# Patient Record
Sex: Female | Born: 1970 | Race: White | Hispanic: No | Marital: Single | Smoking: Never smoker
Health system: Southern US, Community
[De-identification: ages and names within clinical notes are randomized; demographics above are authoritative.]

## PROBLEM LIST (undated history)

## (undated) DIAGNOSIS — F32A Depression, unspecified: Secondary | ICD-10-CM

## (undated) DIAGNOSIS — G629 Polyneuropathy, unspecified: Secondary | ICD-10-CM

## (undated) DIAGNOSIS — M5430 Sciatica, unspecified side: Secondary | ICD-10-CM

## (undated) DIAGNOSIS — M1990 Unspecified osteoarthritis, unspecified site: Secondary | ICD-10-CM

## (undated) DIAGNOSIS — R079 Chest pain, unspecified: Secondary | ICD-10-CM

## (undated) DIAGNOSIS — F4310 Post-traumatic stress disorder, unspecified: Secondary | ICD-10-CM

## (undated) DIAGNOSIS — F419 Anxiety disorder, unspecified: Secondary | ICD-10-CM

## (undated) DIAGNOSIS — E785 Hyperlipidemia, unspecified: Secondary | ICD-10-CM

## (undated) DIAGNOSIS — G8929 Other chronic pain: Secondary | ICD-10-CM

## (undated) DIAGNOSIS — R5382 Chronic fatigue, unspecified: Secondary | ICD-10-CM

## (undated) DIAGNOSIS — G4700 Insomnia, unspecified: Secondary | ICD-10-CM

## (undated) DIAGNOSIS — E739 Lactose intolerance, unspecified: Secondary | ICD-10-CM

## (undated) DIAGNOSIS — R0602 Shortness of breath: Secondary | ICD-10-CM

## (undated) DIAGNOSIS — F329 Major depressive disorder, single episode, unspecified: Secondary | ICD-10-CM

## (undated) DIAGNOSIS — Z91018 Allergy to other foods: Secondary | ICD-10-CM

## (undated) DIAGNOSIS — M797 Fibromyalgia: Secondary | ICD-10-CM

## (undated) DIAGNOSIS — R1310 Dysphagia, unspecified: Secondary | ICD-10-CM

## (undated) DIAGNOSIS — M2550 Pain in unspecified joint: Secondary | ICD-10-CM

## (undated) DIAGNOSIS — K219 Gastro-esophageal reflux disease without esophagitis: Secondary | ICD-10-CM

## (undated) DIAGNOSIS — I10 Essential (primary) hypertension: Secondary | ICD-10-CM

## (undated) DIAGNOSIS — R7303 Prediabetes: Secondary | ICD-10-CM

## (undated) DIAGNOSIS — E559 Vitamin D deficiency, unspecified: Secondary | ICD-10-CM

## (undated) DIAGNOSIS — M62838 Other muscle spasm: Secondary | ICD-10-CM

## (undated) DIAGNOSIS — M549 Dorsalgia, unspecified: Secondary | ICD-10-CM

## (undated) DIAGNOSIS — G9332 Myalgic encephalomyelitis/chronic fatigue syndrome: Secondary | ICD-10-CM

## (undated) DIAGNOSIS — K5900 Constipation, unspecified: Secondary | ICD-10-CM

## (undated) DIAGNOSIS — J45909 Unspecified asthma, uncomplicated: Secondary | ICD-10-CM

## (undated) DIAGNOSIS — K5792 Diverticulitis of intestine, part unspecified, without perforation or abscess without bleeding: Secondary | ICD-10-CM

## (undated) DIAGNOSIS — R002 Palpitations: Secondary | ICD-10-CM

## (undated) DIAGNOSIS — G4730 Sleep apnea, unspecified: Secondary | ICD-10-CM

## (undated) DIAGNOSIS — K589 Irritable bowel syndrome without diarrhea: Secondary | ICD-10-CM

## (undated) HISTORY — DX: Unspecified osteoarthritis, unspecified site: M19.90

## (undated) HISTORY — DX: Constipation, unspecified: K59.00

## (undated) HISTORY — DX: Sciatica, unspecified side: M54.30

## (undated) HISTORY — DX: Vitamin D deficiency, unspecified: E55.9

## (undated) HISTORY — DX: Polyneuropathy, unspecified: G62.9

## (undated) HISTORY — DX: Dysphagia, unspecified: R13.10

## (undated) HISTORY — DX: Allergy to other foods: Z91.018

## (undated) HISTORY — DX: Chest pain, unspecified: R07.9

## (undated) HISTORY — DX: Dorsalgia, unspecified: M54.9

## (undated) HISTORY — DX: Other muscle spasm: M62.838

## (undated) HISTORY — DX: Palpitations: R00.2

## (undated) HISTORY — DX: Fibromyalgia: M79.7

## (undated) HISTORY — DX: Pain in unspecified joint: M25.50

## (undated) HISTORY — DX: Shortness of breath: R06.02

## (undated) HISTORY — DX: Myalgic encephalomyelitis/chronic fatigue syndrome: G93.32

## (undated) HISTORY — DX: Lactose intolerance, unspecified: E73.9

## (undated) HISTORY — DX: Other chronic pain: G89.29

## (undated) HISTORY — DX: Chronic fatigue, unspecified: R53.82

## (undated) HISTORY — DX: Major depressive disorder, single episode, unspecified: F32.9

## (undated) HISTORY — DX: Anxiety disorder, unspecified: F41.9

## (undated) HISTORY — DX: Hyperlipidemia, unspecified: E78.5

## (undated) HISTORY — DX: Sleep apnea, unspecified: G47.30

## (undated) HISTORY — DX: Depression, unspecified: F32.A

## (undated) HISTORY — DX: Irritable bowel syndrome, unspecified: K58.9

## (undated) HISTORY — PX: NASAL SINUS SURGERY: SHX719

## (undated) HISTORY — DX: Essential (primary) hypertension: I10

## (undated) HISTORY — DX: Post-traumatic stress disorder, unspecified: F43.10

## (undated) HISTORY — DX: Insomnia, unspecified: G47.00

## (undated) HISTORY — DX: Prediabetes: R73.03

## (undated) HISTORY — PX: ABLATION: SHX5711

---

## 2012-04-03 ENCOUNTER — Emergency Department (HOSPITAL_BASED_OUTPATIENT_CLINIC_OR_DEPARTMENT_OTHER)
Admission: EM | Admit: 2012-04-03 | Discharge: 2012-04-04 | Disposition: A | Discharge status: Home / Self Care | Payer: Medicaid Other | Attending: Emergency Medicine | Admitting: Emergency Medicine

## 2012-04-03 ENCOUNTER — Encounter (HOSPITAL_BASED_OUTPATIENT_CLINIC_OR_DEPARTMENT_OTHER): Payer: Self-pay

## 2012-04-03 DIAGNOSIS — T50B95A Adverse effect of other viral vaccines, initial encounter: Secondary | ICD-10-CM | POA: Insufficient documentation

## 2012-04-03 DIAGNOSIS — T7840XA Allergy, unspecified, initial encounter: Secondary | ICD-10-CM

## 2012-04-03 DIAGNOSIS — Z331 Pregnant state, incidental: Secondary | ICD-10-CM | POA: Insufficient documentation

## 2012-04-03 DIAGNOSIS — L299 Pruritus, unspecified: Secondary | ICD-10-CM | POA: Insufficient documentation

## 2012-04-03 DIAGNOSIS — R232 Flushing: Secondary | ICD-10-CM | POA: Insufficient documentation

## 2012-04-03 DIAGNOSIS — T50A95A Adverse effect of other bacterial vaccines, initial encounter: Secondary | ICD-10-CM | POA: Insufficient documentation

## 2012-04-03 DIAGNOSIS — Z888 Allergy status to other drugs, medicaments and biological substances status: Secondary | ICD-10-CM | POA: Insufficient documentation

## 2012-04-03 NOTE — ED Notes (Signed)
Pt took benadryl 25mg  PTA

## 2012-04-03 NOTE — ED Notes (Addendum)
C/o face red and chest redness, throat itching x 1.5 hrs-pt NAD-states she feels may be having reaction to flu shot that was given at OB/GYN office approx 330pm -? Slight redness to chest and cheeks-no resp distress noted-pt is [redacted] weeks pregnant

## 2012-04-03 NOTE — ED Provider Notes (Signed)
History  This chart was scribed for Joya Gaskins, MD by Manuela Schwartz, ED scribe. This patient was seen in room MH01/MH01 and the patient's care was started at 2124.   CSN: [id number]  Arrival date & time 04/03/12  2124   First MD Initiated Contact with Patient 04/03/12 2357      Chief Complaint  Patient presents with  . Allergic Reaction   Patient is a 41 y.o. female presenting with allergic reaction. The history is provided by the patient. No language interpreter was used.  Allergic Reaction The primary symptoms are  rash (redness over face). The primary symptoms do not include wheezing, shortness of breath, cough, nausea, vomiting or dizziness. The current episode started 3 to 5 hours ago. The problem has been gradually improving.  The rash is associated with itching.  The onset of the reaction was associated with a new medication. Significant symptoms also include flushing and itching. Significant symptoms that are not present include rhinorrhea.   Brooke Cruz is a 41 y.o. female who presents to the Emergency Department complaining of having an allergic reaction to a flu vaccine she received while at [facility] this PM and began feeling scratchy throat, red itchy face/lips. She states took benadryl PTA with some relief from her sx. She denies SOB, syncope, dizziness, fever, emesis. She has had a flue shot before without an allergic reaction and she denies any other recent medicines. She is [redacted] weeks pregnant and had normal [place] at 7 weeks.  No vag bleeding.  No abd cramping Past Medical History  Diagnosis Date  . Pregnant     Past Surgical History  Procedure Date  . Cesarean section   . Nasal sinus surgery     No family history on file.  History  Substance Use Topics  . Smoking status: Never Smoker   . Smokeless tobacco: Not on file  . Alcohol Use: No    OB History    Grav Para Term Preterm Abortions TAB SAB Ect Mult Living                  Review of Systems    Constitutional: Negative for fever and chills.  HENT: Negative for congestion and rhinorrhea.   Respiratory: Negative for cough, shortness of breath and wheezing.   Cardiovascular: Negative for chest pain.  Gastrointestinal: Negative for nausea and vomiting.  Skin: Positive for flushing, itching and rash (redness over face).  Neurological: Negative for dizziness and weakness.  All other systems reviewed and are negative.    Allergies  Eggs or egg-derived products and Sulfa antibiotics  Home Medications   Current Outpatient Rx  Name  Route  Sig  Dispense  Refill  . ZYRTEC PO   Oral   Take by mouth.         . COLACE PO   Oral   Take by mouth.         Marland Kitchen PRENATAL VITAMINS PO   Oral   Take by mouth.           Triage Vitals: BP 149/87  Pulse 87  Temp 99.2 F (37.3 C) (Oral)  Resp 16  SpO2 100%  Physical Exam CONSTITUTIONAL: Well developed/well nourished HEAD AND FACE: Normocephalic/atraumatic EYES: EOMI/PERRL ENMT: Mucous membranes moist, no angioedema, no facial erythema NECK: supple no meningeal signs SPINE:entire spine nontender CV: S1/S2 noted, no murmurs/rubs/gallops noted LUNGS: Lungs are clear to auscultation bilaterally, no apparent distress ABDOMEN: soft, nontender, no rebound or guarding GU:no cva tenderness NEURO:  Pt is awake/alert, moves all extremitiesx4 EXTREMITIES: pulses normal, full ROM SKIN: warm, color normal, no rash and no urticaria PSYCH: no abnormalities of mood noted  ED Course  Procedures  DIAGNOSTIC STUDIES: Oxygen Saturation is 100% on room air, normal by my interpretation.    COORDINATION OF CARE: At 1200 AM Discussed treatment plan with patient which includes d/c home w/epi pen and how/when to use it if needed. Patient agrees.    Pt well appearing no signs of allergic rxn on my exam.  She already improved after self medicating with benadryl.  Defer prednisone for now.  I did write Rx for epipen in case of severe allergic  rxn at home.  We discussed when to use epipen.  Does not require epipen at this time  MDM  Nursing notes including past medical history and social history reviewed and considered in documentation  I personally performed the services described in this documentation, which was scribed in my presence. The recorded information has been reviewed and is accurate.            Joya Gaskins, MD 04/04/12 [phone]

## 2012-04-04 MED ORDER — EPINEPHRINE 0.3 MG/0.3ML IJ DEVI: 0.3000 mg | INTRAMUSCULAR | Status: AC | PRN

## 2012-04-04 NOTE — ED Notes (Signed)
MD at bedside. 

## 2012-06-18 ENCOUNTER — Encounter (HOSPITAL_BASED_OUTPATIENT_CLINIC_OR_DEPARTMENT_OTHER): Payer: Self-pay | Admitting: *Deleted

## 2012-06-18 ENCOUNTER — Emergency Department (HOSPITAL_BASED_OUTPATIENT_CLINIC_OR_DEPARTMENT_OTHER)
Admission: EM | Admit: 2012-06-18 | Discharge: 2012-06-18 | Disposition: A | Discharge status: Home / Self Care | Payer: Medicaid Other | Attending: Emergency Medicine | Admitting: Emergency Medicine

## 2012-06-18 DIAGNOSIS — O2390 Unspecified genitourinary tract infection in pregnancy, unspecified trimester: Secondary | ICD-10-CM | POA: Insufficient documentation

## 2012-06-18 DIAGNOSIS — R350 Frequency of micturition: Secondary | ICD-10-CM | POA: Insufficient documentation

## 2012-06-18 DIAGNOSIS — R300 Dysuria: Secondary | ICD-10-CM | POA: Insufficient documentation

## 2012-06-18 DIAGNOSIS — N390 Urinary tract infection, site not specified: Secondary | ICD-10-CM | POA: Insufficient documentation

## 2012-06-18 DIAGNOSIS — O9989 Other specified diseases and conditions complicating pregnancy, childbirth and the puerperium: Secondary | ICD-10-CM | POA: Insufficient documentation

## 2012-06-18 DIAGNOSIS — R109 Unspecified abdominal pain: Secondary | ICD-10-CM | POA: Insufficient documentation

## 2012-06-18 DIAGNOSIS — O219 Vomiting of pregnancy, unspecified: Secondary | ICD-10-CM | POA: Insufficient documentation

## 2012-06-18 LAB — URINALYSIS, ROUTINE W REFLEX MICROSCOPIC
Glucose, UA: NEGATIVE mg/dL
Hgb urine dipstick: NEGATIVE
pH: 6 (ref 5.0–8.0)

## 2012-06-18 LAB — URINE MICROSCOPIC-ADD ON

## 2012-06-18 LAB — CBC WITH DIFFERENTIAL/PLATELET
Band Neutrophils: 2 % (ref 0–10)
Basophils Absolute: 0.1 10*3/uL (ref 0.0–0.1)
Basophils Relative: 1 % (ref 0–1)
Blasts: 0 %
Eosinophils Absolute: 0.2 10*3/uL (ref 0.0–0.7)
Eosinophils Relative: 2 % (ref 0–5)
HCT: 34 % — ABNORMAL LOW (ref 36.0–46.0)
Hemoglobin: 11.6 g/dL — ABNORMAL LOW (ref 12.0–15.0)
Lymphocytes Relative: 15 % (ref 12–46)
Lymphs Abs: 1.5 10*3/uL (ref 0.7–4.0)
Monocytes Absolute: 1 10*3/uL (ref 0.1–1.0)
Monocytes Relative: 10 % (ref 3–12)
RBC: 3.83 MIL/uL — ABNORMAL LOW (ref 3.87–5.11)
WBC: 9.9 10*3/uL (ref 4.0–10.5)

## 2012-06-18 LAB — COMPREHENSIVE METABOLIC PANEL
AST: 12 U/L (ref 0–37)
CO2: 21 mEq/L (ref 19–32)
Calcium: 9 mg/dL (ref 8.4–10.5)
Creatinine, Ser: 0.5 mg/dL (ref 0.50–1.10)
GFR calc non Af Amer: >90 mL/min (ref >90)

## 2012-06-18 MED ORDER — DEXTROSE 5 % IV SOLN
1.0000 g | INTRAVENOUS | Status: DC
  Administered 2012-06-18: 1 g via INTRAVENOUS
  Filled 2012-06-18: qty 10

## 2012-06-18 MED ORDER — SODIUM CHLORIDE 0.9 % IV BOLUS (SEPSIS)
1000.0000 mL | Freq: Once | INTRAVENOUS | Status: AC
  Administered 2012-06-18: 1000 mL via INTRAVENOUS

## 2012-06-18 MED ORDER — AMOXICILLIN 500 MG PO CAPS: 500.0000 mg | ORAL_CAPSULE | Freq: Three times a day (TID) | ORAL | Status: DC

## 2012-06-18 NOTE — Progress Notes (Signed)
Received call from [facility]. Pt presents with lower abd pressure and painful urination. Receives care from [facility].

## 2012-06-18 NOTE — ED Notes (Signed)
EDPA Sofia at bedside. 

## 2012-06-18 NOTE — ED Provider Notes (Signed)
History     CSN: [id number]  Arrival date & time 06/18/12  1758   First MD Initiated Contact with Patient 06/18/12 1828      Chief Complaint  Patient presents with  . Abdominal Pain    (Consider location/radiation/quality/duration/timing/severity/associated sxs/prior treatment) Patient is a 42 y.o. female presenting with dysuria. The history is provided by the patient. No language interpreter was used.  Dysuria  This is a new problem. The current episode started 12 to 24 hours ago. The problem occurs every urination. The problem has been gradually worsening. The quality of the pain is described as burning. The patient is experiencing no pain. There has been no fever. Associated symptoms include nausea, vomiting and frequency. Pertinent negatives include no chills. She has tried nothing for the symptoms. Her past medical history does not include kidney stones.   Pt reports she began having burning with urination.  Pt is [redacted] weeks pregnant Past Medical History  Diagnosis Date  . Pregnant     Past Surgical History  Procedure Laterality Date  . Cesarean section    . Nasal sinus surgery      History reviewed. No pertinent family history.  History  Substance Use Topics  . Smoking status: Never Smoker   . Smokeless tobacco: Not on file  . Alcohol Use: No    OB History   Grav Para Term Preterm Abortions TAB SAB Ect Mult Living   6 3   2  2          Review of Systems  Constitutional: Negative for chills.  Gastrointestinal: Positive for nausea and vomiting.  Genitourinary: Positive for dysuria and frequency.  All other systems reviewed and are negative.    Allergies  Eggs or egg-derived products and Sulfa antibiotics  Home Medications   Current Outpatient Rx  Name  Route  Sig  Dispense  Refill  . Cetirizine HCl (ZYRTEC PO)   Oral   Take by mouth.         Tery Sanfilippo Sodium (COLACE PO)   Oral   Take by mouth.         . EPINEPHrine (EPIPEN) 0.3 mg/0.3 mL  DEVI   Intramuscular   Inject 0.3 mLs (0.3 mg total) into the muscle as needed.   2 Device   1   . PRENATAL VITAMINS PO   Oral   Take by mouth.           BP 134/77  Pulse 86  Temp(Src) 98.7 F (37.1 C) (Oral)  Resp 20  Ht 5\' 2"  (1.575 m)  Wt 169 lb 8 oz (76.885 kg)  BMI 30.99 kg/m2  SpO2 99%  LMP 01/19/2012  Physical Exam  Nursing note and vitals reviewed. Constitutional: She is oriented to person, place, and time. She appears well-developed and well-nourished.  HENT:  Head: Normocephalic and atraumatic.  Eyes: Conjunctivae are normal. Pupils are equal, round, and reactive to light.  Neck: Normal range of motion. Neck supple.  Cardiovascular: Normal rate.   Pulmonary/Chest: Effort normal and breath sounds normal.  Abdominal: Soft. There is no tenderness.  Musculoskeletal: Normal range of motion.  Neurological: She is alert and oriented to person, place, and time. She has normal reflexes.  Skin: Skin is warm.  Psychiatric: She has a normal mood and affect.    ED Course  Procedures (including critical care time)  Labs Reviewed  URINALYSIS, ROUTINE W REFLEX MICROSCOPIC - Abnormal; Notable for the following:    APPearance CLOUDY (*)  Leukocytes, UA SMALL (*)    All other components within normal limits  URINE MICROSCOPIC-ADD ON - Abnormal; Notable for the following:    Bacteria, UA MANY (*)    Crystals CA OXALATE CRYSTALS (*)    All other components within normal limits  URINE CULTURE   No results found.   1. UTI (lower urinary tract infection)       MDM  Pt given Iv rocephin and 1 liter of fluids.   Pt advised follow up with Dr. Shawnie Pons as scheduled.  Urine culture pending.  Amoxicillian        Elson Areas, PA-C 06/18/12 2029

## 2012-06-18 NOTE — ED Notes (Signed)
Pt states she is [redacted] weeks pregnant G6P3 and is having some dysuria, cramping and pelvic pressure. No discharge.

## 2012-06-18 NOTE — ED Notes (Signed)
EDPA Sofia updated on pt status

## 2012-06-18 NOTE — Progress Notes (Signed)
Received call from [facility] RN from [facility] that patient is discharged home

## 2012-06-19 NOTE — ED Provider Notes (Signed)
Medical screening examination/treatment/procedure(s) were performed by non-physician practitioner and as supervising physician I was immediately available for consultation/collaboration.   Jakalyn Kratky III, MD 06/19/12 1027 

## 2012-06-20 LAB — URINE CULTURE

## 2012-11-01 ENCOUNTER — Emergency Department (HOSPITAL_BASED_OUTPATIENT_CLINIC_OR_DEPARTMENT_OTHER)
Admission: EM | Admit: 2012-11-01 | Discharge: 2012-11-02 | Disposition: A | Discharge status: Home / Self Care | Payer: Medicaid Other | Attending: Emergency Medicine | Admitting: Emergency Medicine

## 2012-11-01 ENCOUNTER — Encounter (HOSPITAL_BASED_OUTPATIENT_CLINIC_OR_DEPARTMENT_OTHER): Payer: Self-pay

## 2012-11-01 DIAGNOSIS — N898 Other specified noninflammatory disorders of vagina: Secondary | ICD-10-CM | POA: Insufficient documentation

## 2012-11-01 DIAGNOSIS — Z3202 Encounter for pregnancy test, result negative: Secondary | ICD-10-CM | POA: Insufficient documentation

## 2012-11-01 DIAGNOSIS — N390 Urinary tract infection, site not specified: Secondary | ICD-10-CM | POA: Insufficient documentation

## 2012-11-01 DIAGNOSIS — Z79899 Other long term (current) drug therapy: Secondary | ICD-10-CM | POA: Insufficient documentation

## 2012-11-01 DIAGNOSIS — Z9889 Other specified postprocedural states: Secondary | ICD-10-CM | POA: Insufficient documentation

## 2012-11-01 DIAGNOSIS — N939 Abnormal uterine and vaginal bleeding, unspecified: Secondary | ICD-10-CM

## 2012-11-01 DIAGNOSIS — Z91040 Latex allergy status: Secondary | ICD-10-CM | POA: Insufficient documentation

## 2012-11-01 LAB — URINALYSIS, ROUTINE W REFLEX MICROSCOPIC
Nitrite: NEGATIVE
Specific Gravity, Urine: 1.012 (ref 1.005–1.030)
Urobilinogen, UA: 0.2 mg/dL (ref 0.0–1.0)

## 2012-11-01 LAB — URINE MICROSCOPIC-ADD ON

## 2012-11-01 LAB — PREGNANCY, URINE: Preg Test, Ur: NEGATIVE

## 2012-11-01 NOTE — ED Notes (Signed)
Csection 7/13-c/o pain to lower abd, rectum, lower back-pain worsened after formed BM approx 1 hour PTA

## 2012-11-02 ENCOUNTER — Emergency Department (HOSPITAL_BASED_OUTPATIENT_CLINIC_OR_DEPARTMENT_OTHER): Payer: Medicaid Other

## 2012-11-02 LAB — COMPREHENSIVE METABOLIC PANEL
ALT: 17 U/L (ref 0–35)
AST: 19 U/L (ref 0–37)
Albumin: 3.2 g/dL — ABNORMAL LOW (ref 3.5–5.2)
CO2: 27 mEq/L (ref 19–32)
Calcium: 10.1 mg/dL (ref 8.4–10.5)
Chloride: 104 mEq/L (ref 96–112)
GFR calc non Af Amer: >90 mL/min (ref >90)
Sodium: 140 mEq/L (ref 135–145)
Total Bilirubin: 0.2 mg/dL — ABNORMAL LOW (ref 0.3–1.2)

## 2012-11-02 LAB — CBC WITH DIFFERENTIAL/PLATELET
Basophils Absolute: 0 10*3/uL (ref 0.0–0.1)
Lymphocytes Relative: 24 % (ref 12–46)
Neutro Abs: 4 10*3/uL (ref 1.7–7.7)
Platelets: 367 10*3/uL (ref 150–400)
RDW: 15 % (ref 11.5–15.5)
WBC: 6.4 10*3/uL (ref 4.0–10.5)

## 2012-11-02 LAB — WET PREP, GENITAL
Clue Cells Wet Prep HPF POC: NONE SEEN
Yeast Wet Prep HPF POC: NONE SEEN

## 2012-11-02 MED ORDER — METHYLERGONOVINE MALEATE 0.2 MG PO TABS: 0.2000 mg | ORAL_TABLET | Freq: Three times a day (TID) | ORAL | Status: DC

## 2012-11-02 MED ORDER — CEPHALEXIN 500 MG PO CAPS: 500.0000 mg | ORAL_CAPSULE | Freq: Four times a day (QID) | ORAL | Status: DC

## 2012-11-02 MED ORDER — ONDANSETRON HCL 4 MG/2ML IJ SOLN
4.0000 mg | Freq: Once | INTRAMUSCULAR | Status: AC
  Administered 2012-11-02: 4 mg via INTRAVENOUS
  Filled 2012-11-02: qty 2

## 2012-11-02 MED ORDER — HYDROCODONE-ACETAMINOPHEN 5-325 MG PO TABS: 2.0000 | ORAL_TABLET | ORAL | Status: DC | PRN

## 2012-11-02 MED ORDER — MORPHINE SULFATE 4 MG/ML IJ SOLN
4.0000 mg | Freq: Once | INTRAMUSCULAR | Status: AC
  Administered 2012-11-02: 4 mg via INTRAVENOUS
  Filled 2012-11-02: qty 1

## 2012-11-02 NOTE — ED Provider Notes (Signed)
CSN: [id number]     Arrival date & time 11/01/12  2245 History     First MD Initiated Contact with Patient 11/02/12 0040     Chief Complaint  Patient presents with  . Abdominal Pain   (Consider location/radiation/quality/duration/timing/severity/associated sxs/prior Treatment) Patient is a 42 y.o. female presenting with abdominal pain. The history is provided by the patient.  Abdominal Pain This is a new problem. The current episode started more than 1 week ago (worse while having a B< just prior to arrival.  But has been ongoing since c section on 7/13). The problem occurs constantly. The problem has been gradually worsening. Associated symptoms include abdominal pain. Pertinent negatives include no chest pain, no headaches and no shortness of breath. Nothing aggravates the symptoms. Nothing relieves the symptoms. She has tried nothing for the symptoms. The treatment provided no relief.  Dark lochia is ongoing.    Past Medical History  Diagnosis Date  . Pregnant    Past Surgical History  Procedure Laterality Date  . Cesarean section    . Nasal sinus surgery     No family history on file. History  Substance Use Topics  . Smoking status: Never Smoker   . Smokeless tobacco: Not on file  . Alcohol Use: No   OB History   Grav Para Term Preterm Abortions TAB SAB Ect Mult Living   6 3   2  2         Review of Systems  Constitutional: Negative for fever.  Respiratory: Negative for shortness of breath.   Cardiovascular: Negative for chest pain.  Gastrointestinal: Positive for abdominal pain. Negative for vomiting and diarrhea.  Genitourinary: Positive for vaginal bleeding. Negative for dysuria and vaginal discharge.  Neurological: Negative for headaches.  All other systems reviewed and are negative.    Allergies  Eggs or egg-derived products; Fluoride preparations; Latex; and Sulfa antibiotics  Home Medications   Current Outpatient Rx  Name  Route  Sig  Dispense  Refill   . labetalol (NORMODYNE) 200 MG tablet   Oral   Take 200 mg by mouth 2 (two) times daily.         Marland Kitchen amoxicillin (AMOXIL) 500 MG capsule   Oral   Take 1 capsule (500 mg total) by mouth 3 (three) times daily.   30 capsule   0   . amoxicillin (AMOXIL) 500 MG capsule   Oral   Take 1 capsule (500 mg total) by mouth 3 (three) times daily.   30 capsule   0   . Cetirizine HCl (ZYRTEC PO)   Oral   Take by mouth.         Tery Sanfilippo Sodium (COLACE PO)   Oral   Take by mouth.         . EPINEPHrine (EPIPEN) 0.3 mg/0.3 mL DEVI   Intramuscular   Inject 0.3 mLs (0.3 mg total) into the muscle as needed.   2 Device   1   . PRENATAL VITAMINS PO   Oral   Take by mouth.          BP 141/78  Pulse 74  Temp(Src) 98.8 F (37.1 C) (Oral)  Resp 16  Ht 5\' 2"  (1.575 m)  SpO2 98%  LMP 01/19/2012  Breastfeeding? Unknown Physical Exam  Constitutional: She is oriented to person, place, and time. She appears well-developed and well-nourished. No distress.  HENT:  Head: Normocephalic and atraumatic.  Mouth/Throat: Oropharynx is clear and moist.  Eyes: Conjunctivae are normal. Pupils are  equal, round, and reactive to light.  Neck: Normal range of motion. Neck supple.  Cardiovascular: Normal rate, regular rhythm and intact distal pulses.   Pulmonary/Chest: Effort normal and breath sounds normal. She has no wheezes. She has no rales.  Abdominal: Soft. She exhibits no mass. Bowel sounds are increased. There is no tenderness. There is no rebound and no guarding.  Genitourinary: Cervix exhibits no motion tenderness. Right adnexum displays no mass and no tenderness. Left adnexum displays no mass and no tenderness.  Chaperone present dark blood and small clot per os.  No CMT no adnexal tenderness  Musculoskeletal: Normal range of motion.  Neurological: She is alert and oriented to person, place, and time.  Skin: Skin is warm and dry.  Psychiatric: She has a normal mood and affect.    ED  Course   Procedures (including critical care time)  Labs Reviewed  URINALYSIS, ROUTINE W REFLEX MICROSCOPIC - Abnormal; Notable for the following:    Hgb urine dipstick LARGE (*)    Leukocytes, UA LARGE (*)    All other components within normal limits  URINE MICROSCOPIC-ADD ON - Abnormal; Notable for the following:    Bacteria, UA FEW (*)    All other components within normal limits  CBC WITH DIFFERENTIAL - Abnormal; Notable for the following:    RBC 3.73 (*)    Hemoglobin 10.6 (*)    HCT 33.0 (*)    All other components within normal limits  COMPREHENSIVE METABOLIC PANEL - Abnormal; Notable for the following:    Albumin 3.2 (*)    Alkaline Phosphatase 371 (*)    Total Bilirubin 0.2 (*)    All other components within normal limits  URINE CULTURE  PREGNANCY, URINE   Dg Abd Acute W/chest  11/02/2012   *RADIOLOGY REPORT*  Clinical Data: Lower abdominal pain.  History of C-section 2 weeks ago.  ACUTE ABDOMEN SERIES (ABDOMEN 2 VIEW & CHEST 1 VIEW)  Comparison: None.  Findings: Subsegmental atelectasis is present in the lungs, prominent in the right perihilar region. Cardiopericardial silhouette is within normal limits.  There is no free air underneath the hemidiaphragms.  Bowel gas pattern is within normal limits.  Stool and bowel gas extends to the level of the rectosigmoid.  No dilated loops of large or small bowel.  No organomegaly.  IMPRESSION: Normal bowel gas pattern.   Original Report Authenticated By: Andreas Newport, M.D.   No diagnosis found.  MDM  Attempted to contact patient's OB, no oncall provider.    Records obtained and scanned in to patient's chart  234 am case d/w Dr. Emelda Fear. Very unlikely to be endometritis without fever, white count or foul smelling lochia.  Patient should follow up with her GYN in am for ultrasound as likely has retained blood.  Recommends methergine.  Pain in the rectum while using the commode was likely related to gas.  There is no indication for  emergent [place].  Methergine is safe in breast feeding as is norco and patient will need this for cramping related to methergine.  Dr. Emelda Fear reports work up is thorough.     Patient instructed to call her regular OB in the am to be seen and evaluated  Jalaiya Oyster Smitty Cords, MD 11/02/12 [phone]

## 2012-11-02 NOTE — ED Notes (Signed)
MD at bedside giving test results and discussing plan of care for discharge.

## 2012-11-04 LAB — URINE CULTURE: Colony Count: 50000

## 2013-03-30 ENCOUNTER — Encounter (HOSPITAL_BASED_OUTPATIENT_CLINIC_OR_DEPARTMENT_OTHER): Payer: Self-pay | Admitting: Emergency Medicine

## 2013-03-30 DIAGNOSIS — Z91040 Latex allergy status: Secondary | ICD-10-CM | POA: Insufficient documentation

## 2013-03-30 DIAGNOSIS — Z792 Long term (current) use of antibiotics: Secondary | ICD-10-CM | POA: Insufficient documentation

## 2013-03-30 DIAGNOSIS — G43909 Migraine, unspecified, not intractable, without status migrainosus: Secondary | ICD-10-CM | POA: Insufficient documentation

## 2013-03-30 DIAGNOSIS — Z79899 Other long term (current) drug therapy: Secondary | ICD-10-CM | POA: Insufficient documentation

## 2013-03-30 NOTE — ED Notes (Signed)
Pt c/o h/a x 24 hrs  Hx migraine

## 2013-03-31 ENCOUNTER — Emergency Department (HOSPITAL_BASED_OUTPATIENT_CLINIC_OR_DEPARTMENT_OTHER)
Admission: EM | Admit: 2013-03-31 | Discharge: 2013-03-31 | Disposition: A | Discharge status: Home / Self Care | Payer: Medicaid Other | Attending: Emergency Medicine | Admitting: Emergency Medicine

## 2013-03-31 DIAGNOSIS — G43909 Migraine, unspecified, not intractable, without status migrainosus: Secondary | ICD-10-CM

## 2013-03-31 MED ORDER — DIPHENHYDRAMINE HCL 50 MG/ML IJ SOLN
12.5000 mg | Freq: Once | INTRAMUSCULAR | Status: AC
  Administered 2013-03-31: 12.5 mg via INTRAMUSCULAR
  Filled 2013-03-31: qty 1

## 2013-03-31 MED ORDER — METOCLOPRAMIDE HCL 5 MG/ML IJ SOLN
10.0000 mg | Freq: Once | INTRAMUSCULAR | Status: AC
  Administered 2013-03-31: 10 mg via INTRAMUSCULAR
  Filled 2013-03-31: qty 2

## 2013-03-31 MED ORDER — PROMETHAZINE HCL 25 MG/ML IJ SOLN
25.0000 mg | Freq: Four times a day (QID) | INTRAMUSCULAR | Status: DC | PRN
  Administered 2013-03-31: 25 mg via INTRAMUSCULAR

## 2013-03-31 MED ORDER — DEXAMETHASONE SODIUM PHOSPHATE 10 MG/ML IJ SOLN
10.0000 mg | Freq: Once | INTRAMUSCULAR | Status: AC
Start: 2013-03-31 — End: 2013-03-31
  Administered 2013-03-31: 10 mg via INTRAMUSCULAR
  Filled 2013-03-31: qty 1

## 2013-03-31 MED ORDER — PROMETHAZINE HCL 25 MG/ML IJ SOLN
25.0000 mg | Freq: Once | INTRAMUSCULAR | Status: DC
  Filled 2013-03-31: qty 1

## 2013-03-31 NOTE — ED Provider Notes (Signed)
CSN: [id number]     Arrival date & time 03/30/13  2324 History   First MD Initiated Contact with Patient 03/31/13 0146     Chief Complaint  Patient presents with  . Migraine   (Consider location/radiation/quality/duration/timing/severity/associated sxs/prior Treatment) Patient is a 42 y.o. female presenting with migraines. The history is provided by the patient. No language interpreter was used.  Migraine This is a recurrent problem. The current episode started more than 2 days ago. The problem occurs constantly. The problem has been gradually improving. Pertinent negatives include no chest pain, no abdominal pain and no shortness of breath. Nothing aggravates the symptoms. Nothing relieves the symptoms. Treatments tried: fiorcet. The treatment provided mild relief.    Past Medical History  Diagnosis Date  . Pregnant    Past Surgical History  Procedure Laterality Date  . Cesarean section    . Nasal sinus surgery     History reviewed. No pertinent family history. History  Substance Use Topics  . Smoking status: Never Smoker   . Smokeless tobacco: Not on file  . Alcohol Use: No   OB History   Grav Para Term Preterm Abortions TAB SAB Ect Mult Living   6 3   2  2         Review of Systems  Constitutional: Negative for fever.  HENT: Negative for ear pain.   Eyes: Negative for photophobia and visual disturbance.  Respiratory: Negative for shortness of breath.   Cardiovascular: Negative for chest pain.  Gastrointestinal: Negative for abdominal pain.  Musculoskeletal: Negative for neck pain and neck stiffness.  Neurological: Negative for weakness and numbness.  All other systems reviewed and are negative.    Allergies  Eggs or egg-derived products; Latex; Onion; and Sulfa antibiotics  Home Medications   Current Outpatient Rx  Name  Route  Sig  Dispense  Refill  . amoxicillin (AMOXIL) 500 MG capsule   Oral   Take 1 capsule (500 mg total) by mouth 3 (three) times  daily.   30 capsule   0   . amoxicillin (AMOXIL) 500 MG capsule   Oral   Take 1 capsule (500 mg total) by mouth 3 (three) times daily.   30 capsule   0   . cephALEXin (KEFLEX) 500 MG capsule   Oral   Take 1 capsule (500 mg total) by mouth 4 (four) times daily.   28 capsule   0   . Cetirizine HCl (ZYRTEC PO)   Oral   Take by mouth.         Tery Sanfilippo Sodium (COLACE PO)   Oral   Take by mouth.         . EPINEPHrine (EPIPEN) 0.3 mg/0.3 mL DEVI   Intramuscular   Inject 0.3 mLs (0.3 mg total) into the muscle as needed.   2 Device   1   . HYDROcodone-acetaminophen (NORCO) 5-325 MG per tablet   Oral   Take 2 tablets by mouth every 4 (four) hours as needed for pain.   6 tablet   0   . labetalol (NORMODYNE) 200 MG tablet   Oral   Take 200 mg by mouth 2 (two) times daily.         . methylergonovine (METHERGINE) 0.2 MG tablet   Oral   Take 1 tablet (0.2 mg total) by mouth 3 (three) times daily.   21 tablet   0   . PRENATAL VITAMINS PO   Oral   Take by mouth.  BP 155/84  Pulse 71  Temp(Src) 98.4 F (36.9 C) (Oral)  Resp 18  Ht 5\' 2"  (1.575 m)  Wt 169 lb (76.658 kg)  BMI 30.90 kg/m2  SpO2 98% Physical Exam  Constitutional: She is oriented to person, place, and time. She appears well-developed and well-nourished. No distress.  HENT:  Head: Normocephalic and atraumatic.  Mouth/Throat: Oropharynx is clear and moist.  Eyes: Conjunctivae and EOM are normal. Pupils are equal, round, and reactive to light.  Neck: Normal range of motion. Neck supple.  Cardiovascular: Normal rate and regular rhythm.   Pulmonary/Chest: Effort normal and breath sounds normal. She has no wheezes. She has no rales.  Abdominal: Soft. Bowel sounds are normal. There is no tenderness. There is no rebound and no guarding.  Musculoskeletal: Normal range of motion.  Neurological: She is alert and oriented to person, place, and time. She has normal reflexes. No cranial nerve  deficit.  Skin: Skin is warm and dry.  Psychiatric: She has a normal mood and affect.    ED Course  Procedures (including critical care time) Labs Review Labs Reviewed - No data to display Imaging Review No results found.  EKG Interpretation   None       MDM   1. Migraine    Exam and vitals benign and reassuring. No indication for CT nor LP.  Return for fevers, stiff neck or focal neuro deficits  Follow up with your migraine specialist for ongoing care    Julienne Vogler K Duff Pozzi-Rasch, MD 03/31/13 [phone]

## 2013-05-12 ENCOUNTER — Emergency Department (HOSPITAL_COMMUNITY)
Admission: EM | Admit: 2013-05-12 | Discharge: 2013-05-12 | Disposition: A | Discharge status: Home / Self Care | Payer: No Typology Code available for payment source | Attending: Emergency Medicine | Admitting: Emergency Medicine

## 2013-05-12 ENCOUNTER — Encounter (HOSPITAL_COMMUNITY): Payer: Self-pay | Admitting: Emergency Medicine

## 2013-05-12 ENCOUNTER — Emergency Department (HOSPITAL_COMMUNITY): Payer: No Typology Code available for payment source

## 2013-05-12 DIAGNOSIS — S0990XA Unspecified injury of head, initial encounter: Secondary | ICD-10-CM | POA: Diagnosis not present

## 2013-05-12 DIAGNOSIS — S3981XA Other specified injuries of abdomen, initial encounter: Secondary | ICD-10-CM | POA: Insufficient documentation

## 2013-05-12 DIAGNOSIS — S0993XA Unspecified injury of face, initial encounter: Secondary | ICD-10-CM | POA: Diagnosis present

## 2013-05-12 DIAGNOSIS — Z91040 Latex allergy status: Secondary | ICD-10-CM | POA: Diagnosis not present

## 2013-05-12 DIAGNOSIS — Z79899 Other long term (current) drug therapy: Secondary | ICD-10-CM | POA: Diagnosis not present

## 2013-05-12 DIAGNOSIS — Y9389 Activity, other specified: Secondary | ICD-10-CM | POA: Diagnosis not present

## 2013-05-12 DIAGNOSIS — Y92410 Unspecified street and highway as the place of occurrence of the external cause: Secondary | ICD-10-CM | POA: Insufficient documentation

## 2013-05-12 DIAGNOSIS — M549 Dorsalgia, unspecified: Secondary | ICD-10-CM

## 2013-05-12 DIAGNOSIS — IMO0002 Reserved for concepts with insufficient information to code with codable children: Secondary | ICD-10-CM | POA: Diagnosis not present

## 2013-05-12 DIAGNOSIS — Z8659 Personal history of other mental and behavioral disorders: Secondary | ICD-10-CM | POA: Insufficient documentation

## 2013-05-12 DIAGNOSIS — S199XXA Unspecified injury of neck, initial encounter: Principal | ICD-10-CM

## 2013-05-12 DIAGNOSIS — M542 Cervicalgia: Secondary | ICD-10-CM

## 2013-05-12 HISTORY — DX: Depression, unspecified: F32.A

## 2013-05-12 HISTORY — DX: Major depressive disorder, single episode, unspecified: F32.9

## 2013-05-12 MED ORDER — KETOROLAC TROMETHAMINE 60 MG/2ML IM SOLN
60.0000 mg | Freq: Once | INTRAMUSCULAR | Status: AC
  Administered 2013-05-12: 60 mg via INTRAMUSCULAR
  Filled 2013-05-12: qty 2
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
## 2013-05-13 NOTE — ED Provider Notes (Signed)
Medical screening examination/treatment/procedure(s) were performed by non-physician practitioner and as supervising physician I was immediately available for consultation/collaboration.   Nelia Shiobert L Adaya Garmany, MD 05/13/13 1034

## 2013-11-03 ENCOUNTER — Encounter (HOSPITAL_COMMUNITY): Payer: Self-pay | Admitting: Emergency Medicine

## 2013-11-03 ENCOUNTER — Emergency Department (HOSPITAL_COMMUNITY)
Admission: EM | Admit: 2013-11-03 | Discharge: 2013-11-03 | Disposition: A | Discharge status: Home / Self Care | Payer: Medicaid Other | Source: Home / Self Care | Attending: Emergency Medicine | Admitting: Emergency Medicine

## 2013-11-03 DIAGNOSIS — J029 Acute pharyngitis, unspecified: Secondary | ICD-10-CM

## 2013-11-03 LAB — POCT RAPID STREP A: Streptococcus, Group A Screen (Direct): NEGATIVE

## 2013-11-03 MED ORDER — AMOXICILLIN 500 MG PO CAPS: 500.0000 mg | ORAL_CAPSULE | Freq: Two times a day (BID) | ORAL | Status: DC

## 2013-11-03 NOTE — ED Provider Notes (Signed)
CSN: [id number]     Arrival date & time 11/03/13  1409 History   First MD Initiated Contact with Patient 11/03/13 1418     Chief Complaint  Patient presents with  . Sore Throat   (Consider location/radiation/quality/duration/timing/severity/associated sxs/prior Treatment) HPI She is here today for evaluation of sore throat. Her throat started bothering her this morning. There is no associated cough, rhinorrhea, nasal congestion, ear pain, shortness of breath. She does have some mild nausea. No vomiting, abdominal pain, or diarrhea. No fevers or chills. It is worse with swallowing. She has not had any solid food, but is tolerating fluids well. She states her daughter 3 days ago was diagnosed with bilateral ear infections, and in the last 2 days went on to develop runny nose and cough.  Past Medical History  Diagnosis Date  . Pregnant   . Depression    Past Surgical History  Procedure Laterality Date  . Cesarean section    . Nasal sinus surgery     History reviewed. No pertinent family history. History  Substance Use Topics  . Smoking status: Never Smoker   . Smokeless tobacco: Not on file  . Alcohol Use: No   OB History   Grav Para Term Preterm Abortions TAB SAB Ect Mult Living   6 3   2  2         Review of Systems  Constitutional: Negative.   HENT: Positive for sore throat. Negative for congestion and rhinorrhea.   Eyes: Negative.   Respiratory: Negative.   Cardiovascular: Negative.   Gastrointestinal: Positive for nausea. Negative for vomiting, abdominal pain and diarrhea.    Allergies  Eggs or egg-derived products; Fluoride preparations; Latex; Onion; Other; and Sulfa antibiotics  Home Medications   Prior to Admission medications   Medication Sig Start Date End Date Taking? Authorizing Provider  Cetirizine HCl (ZYRTEC PO) Take 10 mg by mouth daily.    Yes Historical Provider, MD  cholecalciferol (VITAMIN D) 1000 UNITS tablet Take 1,000 Units by mouth daily.   Yes  Historical Provider, MD  co-enzyme Q-10 50 MG capsule Take 50 mg by mouth daily.   Yes Historical Provider, MD  EPINEPHrine (EPIPEN) 0.3 mg/0.3 mL DEVI Inject 0.3 mLs (0.3 mg total) into the muscle as needed. 04/04/12  Yes Joya Gaskinsonald W Wickline, MD  PRENATAL VITAMINS PO Take 1 tablet by mouth daily.    Yes Historical Provider, MD  vitamin B-12 (CYANOCOBALAMIN) 100 MCG tablet Take 100 mcg by mouth daily.   Yes Historical Provider, MD  amoxicillin (AMOXIL) 500 MG capsule Take 1 capsule (500 mg total) by mouth 2 (two) times daily. 11/03/13   Charm RingsErin J Knute Mazzuca, MD   BP 123/80  Pulse 67  Temp(Src) 98.5 F (36.9 C) (Oral)  SpO2 96%  LMP 10/22/2013 Physical Exam  Constitutional: She is oriented to person, place, and time. She appears well-developed and well-nourished. No distress.  HENT:  Head: Normocephalic and atraumatic.  Right Ear: Tympanic membrane and external ear normal.  Left Ear: Tympanic membrane and external ear normal.  Nose: Nose normal.  Mouth/Throat: Mucous membranes are normal. Posterior oropharyngeal erythema present. No oropharyngeal exudate or posterior oropharyngeal edema.  Eyes: Conjunctivae are normal. Right eye exhibits no discharge. Left eye exhibits no discharge.  Neck: Normal range of motion. Neck supple.  Cardiovascular: Normal rate, regular rhythm and normal heart sounds.   No murmur heard. Pulmonary/Chest: Effort normal and breath sounds normal. No respiratory distress. She has no wheezes. She has no rales.  Lymphadenopathy:  She has no cervical adenopathy.  Neurological: She is alert and oriented to person, place, and time.  Skin: Skin is warm and dry. No rash noted.  Psychiatric: She has a normal mood and affect. Her behavior is normal.    ED Course  Procedures (including critical care time) Labs Review Labs Reviewed  POCT RAPID STREP A ([facility] CARE ONLY)    Imaging Review No results found.   MDM   1. Pharyngitis    Rapid strep is negative. I suspect  this is a viral pharyngitis, despite lack of other URI symptoms. Discussed symptomatic treatment. Given that her physician is located in [place], I did provide a prescription for amoxicillin to be filled if she has not developed additional URI symptoms in 48 hours. She states understanding and agreement with this plan. Followup as needed.    Charm Rings, MD 11/03/13 1447

## 2013-11-03 NOTE — ED Notes (Signed)
C/o  Sore throat.  Headache.  Nausea.  On set today.  No otc meds taken.  Denies fever, vomiting and diarrhea.

## 2013-11-03 NOTE — Discharge Instructions (Signed)
You likely have a viral pharyngitis. Make sure you drink plenty of fluids.  If you have not developed a cough or runny nose in the next 2 days, please fill the amoxicillin prescription. Follow up as needed.

## 2013-11-05 LAB — CULTURE, GROUP A STREP

## 2014-02-04 ENCOUNTER — Encounter (HOSPITAL_COMMUNITY): Payer: Self-pay | Admitting: Emergency Medicine

## 2014-02-20 DIAGNOSIS — F329 Major depressive disorder, single episode, unspecified: Secondary | ICD-10-CM | POA: Insufficient documentation

## 2014-02-20 DIAGNOSIS — F32A Depression, unspecified: Secondary | ICD-10-CM | POA: Insufficient documentation

## 2014-02-21 DIAGNOSIS — K219 Gastro-esophageal reflux disease without esophagitis: Secondary | ICD-10-CM | POA: Insufficient documentation

## 2014-02-21 DIAGNOSIS — J45990 Exercise induced bronchospasm: Secondary | ICD-10-CM | POA: Insufficient documentation

## 2014-04-22 ENCOUNTER — Ambulatory Visit: Payer: Medicaid Other | Attending: Physical Medicine and Rehabilitation | Admitting: Physical Therapy

## 2014-04-22 DIAGNOSIS — M545 Low back pain: Secondary | ICD-10-CM | POA: Insufficient documentation

## 2014-04-22 DIAGNOSIS — M542 Cervicalgia: Secondary | ICD-10-CM | POA: Insufficient documentation

## 2014-04-22 DIAGNOSIS — M5116 Intervertebral disc disorders with radiculopathy, lumbar region: Secondary | ICD-10-CM | POA: Insufficient documentation

## 2014-05-02 ENCOUNTER — Ambulatory Visit: Payer: Medicaid Other | Admitting: Physical Therapy

## 2014-05-02 DIAGNOSIS — M542 Cervicalgia: Secondary | ICD-10-CM | POA: Diagnosis not present

## 2014-05-02 DIAGNOSIS — M545 Low back pain: Secondary | ICD-10-CM | POA: Diagnosis not present

## 2014-05-02 DIAGNOSIS — M5116 Intervertebral disc disorders with radiculopathy, lumbar region: Secondary | ICD-10-CM | POA: Diagnosis not present

## 2014-05-06 ENCOUNTER — Ambulatory Visit: Payer: Medicaid Other | Attending: Physical Medicine and Rehabilitation | Admitting: Physical Therapy

## 2014-05-06 DIAGNOSIS — M5116 Intervertebral disc disorders with radiculopathy, lumbar region: Secondary | ICD-10-CM | POA: Diagnosis not present

## 2014-05-06 DIAGNOSIS — M542 Cervicalgia: Secondary | ICD-10-CM | POA: Insufficient documentation

## 2014-05-06 DIAGNOSIS — M545 Low back pain: Secondary | ICD-10-CM | POA: Insufficient documentation

## 2014-05-10 ENCOUNTER — Ambulatory Visit: Payer: Medicaid Other | Admitting: Physical Therapy

## 2014-05-10 DIAGNOSIS — M542 Cervicalgia: Secondary | ICD-10-CM | POA: Diagnosis not present

## 2014-05-13 ENCOUNTER — Ambulatory Visit: Payer: Medicaid Other | Admitting: Physical Therapy

## 2014-05-13 DIAGNOSIS — M542 Cervicalgia: Secondary | ICD-10-CM | POA: Diagnosis not present

## 2014-05-17 ENCOUNTER — Ambulatory Visit: Payer: Medicaid Other | Admitting: Physical Therapy

## 2014-05-17 DIAGNOSIS — M542 Cervicalgia: Secondary | ICD-10-CM | POA: Diagnosis not present

## 2014-05-20 ENCOUNTER — Ambulatory Visit: Payer: Medicaid Other | Admitting: Physical Therapy

## 2014-05-20 DIAGNOSIS — M542 Cervicalgia: Secondary | ICD-10-CM | POA: Diagnosis not present

## 2014-05-24 ENCOUNTER — Encounter: Payer: Self-pay | Admitting: Physical Therapy

## 2014-05-24 ENCOUNTER — Ambulatory Visit: Payer: Medicaid Other | Admitting: Physical Therapy

## 2014-05-24 DIAGNOSIS — M542 Cervicalgia: Secondary | ICD-10-CM

## 2014-05-24 DIAGNOSIS — M545 Low back pain: Secondary | ICD-10-CM

## 2014-05-24 DIAGNOSIS — M5116 Intervertebral disc disorders with radiculopathy, lumbar region: Secondary | ICD-10-CM

## 2014-05-24 NOTE — Therapy (Signed)
[facility] [address] [place], [place], [address] Phone: [phone]   Fax:  [phone]  Physical Therapy Treatment  Patient Details  Name: Brooke Cruz MRN: [id number] Date of Birth: Oct 17, 1970 Referring Provider:  Ivan CroftBartko, Albert K II, MD  Encounter Date: 05/24/2014      PT End of Session - 05/24/14 1150    Visit Number 7   Number of Visits 16   Date for PT Re-Evaluation 07/01/14   PT Start Time 1103   PT Stop Time 1202   PT Time Calculation (min) 59 min   Activity Tolerance Patient tolerated treatment well;Patient limited by pain      Past Medical History  Diagnosis Date  . Pregnant   . Depression     Past Surgical History  Procedure Laterality Date  . Cesarean section    . Nasal sinus surgery      There were no vitals taken for this visit.  Visit Diagnosis:  Pain in neck  Bilateral low back pain, with sciatica presence unspecified  Lumbar disc disease with radiculopathy      Subjective Assessment - 05/24/14 1104    Symptoms feeling great from the waist up   Limitations House hold activities;Standing;Walking   How long can you sit comfortably? 15 minutes   How long can you stand comfortably? 3 to 5 minutes   How long can you walk comfortably? 3 to 5 minutes   Currently in Pain? Yes   Pain Score 6    Pain Location Back   Pain Orientation Right;Left   Pain Descriptors / Indicators Aching   Pain Type Chronic pain   Pain Radiating Towards bilateral feet   Pain Onset 1 to 4 weeks ago   Pain Frequency Constant   Aggravating Factors  walking   Pain Relieving Factors rest, heat, meds   Effect of Pain on Daily Activities limiting   Multiple Pain Sites No          [facility] PT Assessment - 05/24/14 0001    AROM   Lumbar Flexion decreased 75%   Lumbar Extension WFL's   Lumbar - Right Side Bend decreased 25%   Lumbar - Left Side Bend decreased 25%                  [facility]  Adult PT Treatment/Exercise - 05/24/14 0001    Exercises   Exercises Lumbar   Lumbar Exercises: Aerobic   Stationary Bike nustep  level 6 x 6 min   Lumbar Exercises: Machines for Strengthening   Leg Press 30#  2x10   Lumbar Exercises: Standing   Other Standing Lumbar Exercises bilateral trunk rotation  10# 2x10   Other Standing Lumbar Exercises modified hip ext  2x10   Lumbar Exercises: Supine   Bridge 10 reps  2 sets   Straight Leg Raise 10 reps  2 sets    Other Supine Lumbar Exercises KTC, rotation with ball  2x15   Modalities   Modalities Traction;Moist Heat;Electrical Stimulation   Moist Heat Therapy   Number Minutes Moist Heat 15 Minutes   Moist Heat Location Other (comment)  lumbar   Electrical Stimulation   Electrical Stimulation Location lumbar   Electrical Stimulation Goals Pain   Traction   Type of Traction Lumbar   Min (lbs) 60   Max (lbs) 60   Hold Time static   Time 15 min  PT Short Term Goals - 05/24/14 1132    PT SHORT TERM GOAL #1   Title be independent with initial HEP   Time 4   Period Weeks   Status Achieved           PT Long Term Goals - 05/24/14 1133    PT LONG TERM GOAL #1   Title demonstrate and/or verbalize techniques to reduce the risk of re-injury to include info on: posture, body mechanics, lifting   Time 8   Period Weeks   Status Achieved   PT LONG TERM GOAL #2   Title be independent with advanced HEP   Time 8   Period Weeks   Status On-going   PT LONG TERM GOAL #3   Title report pain decrease 50%   Time 8   Period Weeks   Status On-going   PT LONG TERM GOAL #4   Title increase ROM 25%   Time 8   Period Weeks   Status Achieved   PT LONG TERM GOAL #5   Title lift child without extra pain   Time 8   Period Weeks   Status On-going   Additional Long Term Goals   Additional Long Term Goals Yes   PT LONG TERM GOAL #6   Title tolerate standing for 20 minutes   Time 8   Period Weeks    Status On-going               Plan - 05/24/14 1137    Clinical Impression Statement Patient is guarded with limited ROM.  Antalgic movements. Tight lumbar paraspinals.   Pt will benefit from skilled therapeutic intervention in order to improve on the following deficits Decreased mobility;Decreased range of motion;Decreased strength;Impaired flexibility;Pain   Rehab Potential Excellent   PT Frequency 2x / week   PT Duration 8 weeks   PT Treatment/Interventions Electrical Stimulation;Moist Heat;Traction;Ultrasound;Functional mobility training;Therapeutic activities;Therapeutic exercise;Patient/family education;Manual techniques;Passive range of motion   PT Next Visit Plan Continue to strengthen and work on flexibility for improved movement and decreased pain.   Consulted and Agree with Plan of Care Patient        Problem List There are no active problems to display for this patient.   Brooke Cruz  PTA 05/24/2014, 11:54 AM  [facility] [place] [facility] [address] [place], [place], [address] Phone: [phone]   Fax:  [phone]

## 2014-05-30 ENCOUNTER — Ambulatory Visit: Payer: Medicaid Other | Admitting: Physical Therapy

## 2014-05-30 DIAGNOSIS — M545 Low back pain: Secondary | ICD-10-CM

## 2014-05-30 DIAGNOSIS — M542 Cervicalgia: Secondary | ICD-10-CM

## 2014-05-30 DIAGNOSIS — M5116 Intervertebral disc disorders with radiculopathy, lumbar region: Secondary | ICD-10-CM

## 2014-05-30 NOTE — Therapy (Signed)
[facility] [address] [place], [place], [address] Phone: [phone]   Fax:  [phone]  Physical Therapy Treatment  Patient Details  Name: Brooke Cruz MRN: [id number] Date of Birth: 09-02-1970 Referring Provider:  Ivan CroftBartko, Albert K II, MD  Encounter Date: 05/30/2014      PT End of Session - 05/30/14 1511    Visit Number 8   Number of Visits 16   Date for PT Re-Evaluation 07/01/14   PT Start Time 1410   PT Stop Time 1520   PT Time Calculation (min) 70 min   Activity Tolerance Patient tolerated treatment well;Patient limited by pain      Past Medical History  Diagnosis Date  . Pregnant   . Depression     Past Surgical History  Procedure Laterality Date  . Cesarean section    . Nasal sinus surgery      There were no vitals taken for this visit.  Visit Diagnosis:  Pain in neck  Bilateral low back pain, with sciatica presence unspecified  Lumbar disc disease with radiculopathy      Subjective Assessment - 05/30/14 1419    Symptoms rt side neck is tight and painful today   Currently in Pain? Yes   Pain Score 3    Pain Orientation --  rt side neck   Pain Descriptors / Indicators Burning  tight   Pain Type Chronic pain   Pain Onset More than a month ago   Pain Frequency Constant   Aggravating Factors  keyboarding   Pain Relieving Factors heat and massage   Effect of Pain on Daily Activities hard to pick up little baby girl.   Multiple Pain Sites No          [facility] PT Assessment - 05/30/14 0001    ROM / Strength   AROM / PROM / Strength Strength   AROM   AROM Assessment Site Lumbar   Cervical Flexion 2 fingers from chest   Cervical Extension limited by 15%   Cervical - Right Side Bend 30   Cervical - Left Side Bend 10   Cervical - Right Rotation 10   Cervical - Left Rotation 40   Lumbar Flexion 5 in. from floor  5 in from floor   Lumbar Extension Limited 25%  decreased by  25%   Lumbar - Right Side Bend [facility]   Lumbar - Left Side Bend WFL   Lumbar - Right Rotation limited by 25%   Lumbar - Left Rotation limited by 85%                  [facility] Adult PT Treatment/Exercise - 05/30/14 0001    Exercises   Exercises Lumbar   Lumbar Exercises: Stretches   Lower Trunk Rotation 10 seconds;5 reps  arms up rotate thoracic , CS, LTR combined opposite directio   Lumbar Exercises: Aerobic   Stationary Bike nustep lev. 6 X 6 min   Lumbar Exercises: Standing   Forward Lunge 2 seconds  High lunge with holding chair arms bilateral 2 X   Other Standing Lumbar Exercises diagonal reach  stand facing wall reach across body diagonal bilateral   Other Standing Lumbar Exercises thoracic rotation  stand hip to wall touch hands turn bilateral   Modalities   Modalities Traction   Moist Heat Therapy   Number Minutes Moist Heat 15 Minutes   Moist Heat Location Other (comment)  lumbar   Electrical Stimulation  Chartered certified accountant Stimulation Goals Pain   Traction   Min (lbs) 60   Max (lbs) 60   Hold Time static   Time 15 min.                  PT Short Term Goals - 05/24/14 1132    PT SHORT TERM GOAL #1   Title be independent with initial HEP   Time 4   Period Weeks   Status Achieved           PT Long Term Goals - 05/24/14 1133    PT LONG TERM GOAL #1   Title demonstrate and/or verbalize techniques to reduce the risk of re-injury to include info on: posture, body mechanics, lifting   Time 8   Period Weeks   Status Achieved   PT LONG TERM GOAL #2   Title be independent with advanced HEP   Time 8   Period Weeks   Status On-going   PT LONG TERM GOAL #3   Title report pain decrease 50%   Time 8   Period Weeks   Status On-going   PT LONG TERM GOAL #4   Title increase ROM 25%   Time 8   Period Weeks   Status Achieved   PT LONG TERM GOAL #5   Title lift child without extra pain   Time 8   Period  Weeks   Status On-going   Additional Long Term Goals   Additional Long Term Goals Yes   PT LONG TERM GOAL #6   Title tolerate standing for 20 minutes   Time 8   Period Weeks   Status On-going               Plan - 05/30/14 1512    Clinical Impression Statement patient did well with new exercises with thoracic and cervical spine rotation and releasing   Rehab Potential Excellent   PT Frequency 2x / week   PT Duration 8 weeks   PT Treatment/Interventions Electrical Stimulation;Moist Heat;Traction;Ultrasound;Functional mobility training;Therapeutic activities;Therapeutic exercise;Patient/family education;Manual techniques;Passive range of motion   PT Next Visit Plan Continue to strengthen and work on flexibility for improved movement and decreased pain.   Consulted and Agree with Plan of Care Patient        Problem List There are no active problems to display for this patient.   Lady Deutscher 05/30/2014, 3:32 PM  [facility] [place] [facility] [address] [place], [place], [address] Phone: [phone]   Fax:  [phone]

## 2014-05-31 DIAGNOSIS — I10 Essential (primary) hypertension: Secondary | ICD-10-CM | POA: Insufficient documentation

## 2014-06-03 ENCOUNTER — Ambulatory Visit: Payer: Medicaid Other | Admitting: Physical Therapy

## 2014-06-04 ENCOUNTER — Ambulatory Visit: Payer: Medicaid Other | Attending: Physical Medicine and Rehabilitation | Admitting: Physical Therapy

## 2014-06-04 ENCOUNTER — Encounter: Payer: Self-pay | Admitting: Physical Therapy

## 2014-06-04 DIAGNOSIS — M545 Low back pain: Secondary | ICD-10-CM | POA: Diagnosis not present

## 2014-06-04 DIAGNOSIS — M542 Cervicalgia: Secondary | ICD-10-CM | POA: Insufficient documentation

## 2014-06-04 DIAGNOSIS — M5116 Intervertebral disc disorders with radiculopathy, lumbar region: Secondary | ICD-10-CM | POA: Insufficient documentation

## 2014-06-04 NOTE — Therapy (Signed)
[facility] [address] [place], [place], [address] Phone: [phone]   Fax:  [phone]  Physical Therapy Treatment  Patient Details  Name: Brooke BouchardVictoria Cruz MRN: [id number] Date of Birth: 30-Jan-1971 Referring Provider:  Ivan CroftBartko, Albert K II, MD  Encounter Date: 06/04/2014      PT End of Session - 06/04/14 1359    Visit Number 9   Number of Visits 16   Date for PT Re-Evaluation 07/01/14   PT Start Time 1310   PT Stop Time 1416   PT Time Calculation (min) 66 min   Activity Tolerance Patient limited by pain      Past Medical History  Diagnosis Date  . Pregnant   . Depression     Past Surgical History  Procedure Laterality Date  . Cesarean section    . Nasal sinus surgery      There were no vitals taken for this visit.  Visit Diagnosis:  Pain in neck  Bilateral low back pain, with sciatica presence unspecified  Lumbar disc disease with radiculopathy      Subjective Assessment - 06/04/14 1312    Symptoms Messed myself up.  Turned my ankle.  Ankle is fine, but I twisted and hurt my back.   Currently in Pain? Yes   Pain Score 3    Pain Location Back   Pain Orientation Lower   Pain Descriptors / Indicators Burning   Pain Type Chronic pain   Pain Radiating Towards bilateral feet   Pain Onset More than a month ago   Pain Frequency Constant   Multiple Pain Sites No                    [facility] Adult PT Treatment/Exercise - 06/04/14 0001    Exercises   Exercises Lumbar   Lumbar Exercises: Stretches   Piriformis Stretch 5 reps;20 seconds  bilateral   Lumbar Exercises: Aerobic   Stationary Bike nustep lev. 6 X 6 min   Lumbar Exercises: Standing   Other Standing Lumbar Exercises bilateral hip ext  10# 2x15   Other Standing Lumbar Exercises ball vs wall into ext  10 reps 3 point   Lumbar Exercises: Seated   Long Arc Quad on [place] 2 sets;10 reps   Other Seated Lumbar Exercises pelvic  tilts on ball  2x10   Modalities   Modalities Traction;Moist Heat;Electrical Stimulation   Moist Heat Therapy   Number Minutes Moist Heat 15 Minutes   Moist Heat Location Other (comment)  lumbar   Electrical Stimulation   Electrical Stimulation Location lumbar   Electrical Stimulation Parameters IFC   Electrical Stimulation Goals Pain   Traction   Type of Traction Lumbar   Min (lbs) 60   Max (lbs) 60   Hold Time static   Time 15 min.                PT Education - 06/04/14 1344    Education provided Yes   Education Details taught patient piriformis stretch   Person(s) Educated Patient   Methods Explanation;Demonstration   Comprehension Returned demonstration;Verbalized understanding          PT Short Term Goals - 06/04/14 1338    PT SHORT TERM GOAL #1   Title be independent with initial HEP   Time 4   Period Weeks   Status Achieved           PT Long Term Goals - 06/04/14 1338  PT LONG TERM GOAL #1   Title demonstrate and/or verbalize techniques to reduce the risk of re-injury to include info on: posture, body mechanics, lifting   Time 8   Period Weeks   Status Achieved   PT LONG TERM GOAL #2   Title be independent with advanced HEP   Time 8   Period Weeks   Status On-going   PT LONG TERM GOAL #3   Title report pain decrease 50%   Baseline 5/10   Time 8   Period Weeks   PT LONG TERM GOAL #4   Title increase ROM 25%   Time 8   Period Weeks   Status Achieved   PT LONG TERM GOAL #5   Title lift child without extra pain   Time 8   Period Weeks   Status On-going               Plan - 06/04/14 1402    Clinical Impression Statement Guarding with slow methodical movements.  Difficulty with 3 point extension at wall.  Able to perform piriformis stretch without issues.   Pt will benefit from skilled therapeutic intervention in order to improve on the following deficits Decreased mobility;Decreased range of motion;Decreased  strength;Impaired flexibility;Pain   Rehab Potential Excellent   PT Frequency 2x / week   PT Duration 8 weeks   PT Treatment/Interventions Electrical Stimulation;Moist Heat;Traction;Ultrasound;Functional mobility training;Therapeutic activities;Therapeutic exercise;Patient/family education;Manual techniques;Passive range of motion   PT Next Visit Plan Continue to strengthen and work on flexibility for improved movement and decreased pain.   Consulted and Agree with Plan of Care Patient        Problem List There are no active problems to display for this patient.   Mertis Mosher PTA 06/04/2014, 2:06 PM  [facility] [place] [facility] [address] [place], [place], [address] Phone: [phone]   Fax:  [phone]

## 2014-06-06 ENCOUNTER — Encounter: Payer: Self-pay | Admitting: Physical Therapy

## 2014-06-06 ENCOUNTER — Ambulatory Visit: Payer: Medicaid Other | Admitting: Physical Therapy

## 2014-06-06 DIAGNOSIS — M542 Cervicalgia: Secondary | ICD-10-CM | POA: Diagnosis not present

## 2014-06-06 DIAGNOSIS — M545 Low back pain: Secondary | ICD-10-CM

## 2014-06-06 DIAGNOSIS — M5116 Intervertebral disc disorders with radiculopathy, lumbar region: Secondary | ICD-10-CM

## 2014-06-06 NOTE — Therapy (Signed)
[facility] [place] [facility] [address] [place], [place], [address] Phone: [phone]   Fax:  [phone]  Physical Therapy Treatment  Patient Details  Name: Brooke Cruz MRN: [id number] Date of Birth: 1971-01-22 Referring Provider:  Ivan Croft, MD  Encounter Date: 06/06/2014      PT End of Session - 06/06/14 1647    Visit Number 10   Number of Visits 16   Date for PT Re-Evaluation 07/01/14   PT Start Time 1550   PT Stop Time 1701   PT Time Calculation (min) 71 min   Activity Tolerance Patient limited by pain   Behavior During Therapy [facility] for tasks assessed/performed      Past Medical History  Diagnosis Date  . Pregnant   . Depression     Past Surgical History  Procedure Laterality Date  . Cesarean section    . Nasal sinus surgery      There were no vitals taken for this visit.  Visit Diagnosis:  Pain in neck  Bilateral low back pain, with sciatica presence unspecified  Lumbar disc disease with radiculopathy      Subjective Assessment - 06/06/14 1553    Symptoms Not so good today.  Had to take 800 mg Ibuprophin. Tingling in the knees.   Currently in Pain? Yes   Pain Score 6    Pain Radiating Towards knees    Pain Onset More than a month ago   Multiple Pain Sites No          [facility] PT Assessment - 06/06/14 0001    ROM / Strength   AROM / PROM / Strength Strength   Strength   Strength Assessment Site Hip   Right/Left Hip Right;Left   Right Hip Flexion 4/5   Left Hip Flexion 4/5                  [facility] Adult PT Treatment/Exercise - 06/06/14 0001    Exercises   Exercises Lumbar   Lumbar Exercises: Aerobic   Stationary Bike 6 minutes  Nustep level 5   Lumbar Exercises: Machines for Strengthening   Cybex Knee Extension 10#  2x15   Cybex Knee Flexion 25#  2x15   Lumbar Exercises: Standing   Other Standing Lumbar Exercises bilateral hip ext  red theraband 2x15   Lumbar  Exercises: Supine   Bridge 10 reps  2 sets   Other Supine Lumbar Exercises KTC, rotation with ball  2x15   Modalities   Modalities Traction;Moist Heat;Electrical Stimulation   Traction   Type of Traction Lumbar   Min (lbs) 60   Max (lbs) 60   Hold Time static   Time 15 min.                  PT Short Term Goals - 06/04/14 1338    PT SHORT TERM GOAL #1   Title be independent with initial HEP   Time 4   Period Weeks   Status Achieved           PT Long Term Goals - 06/06/14 1643    PT LONG TERM GOAL #1   Title demonstrate and/or verbalize techniques to reduce the risk of re-injury to include info on: posture, body mechanics, lifting   Time 8   Period Weeks   Status Achieved   PT LONG TERM GOAL #2   Title be independent with advanced HEP   Time 8   Period Weeks  Status On-going   PT LONG TERM GOAL #3   Title report pain decrease 50%   Baseline 5/10   Time 8   Period Weeks   Status On-going   PT LONG TERM GOAL #4   Title increase ROM 25%   Time 8   Period Weeks   Status Achieved   PT LONG TERM GOAL #5   Title lift child without extra pain   Time 8   Period Weeks   Status On-going               Plan - 06/06/14 1644    Clinical Impression Statement Decreased strength in BLE's. Antalgic movements.   Pt will benefit from skilled therapeutic intervention in order to improve on the following deficits Decreased mobility;Decreased range of motion;Decreased strength;Pain   Rehab Potential Excellent   PT Frequency 2x / week   PT Duration 8 weeks   PT Treatment/Interventions Electrical Stimulation;Moist Heat;Traction;Ultrasound;Functional mobility training;Therapeutic activities;Therapeutic exercise;Patient/family education;Manual techniques;Passive range of motion   PT Next Visit Plan Continue to strengthen and stabilize for decreased pain.   Consulted and Agree with Plan of Care Patient        Problem List There are no active problems to  display for this patient.   Sieara Bremer PTA 06/06/2014, 4:48 PM  [facility] [address] [place], [place], [address] Phone: [phone]   Fax:  [phone]

## 2014-06-10 ENCOUNTER — Ambulatory Visit: Payer: Medicaid Other | Admitting: Physical Therapy

## 2014-06-10 ENCOUNTER — Encounter: Payer: Self-pay | Admitting: Physical Therapy

## 2014-06-10 DIAGNOSIS — M542 Cervicalgia: Secondary | ICD-10-CM | POA: Diagnosis not present

## 2014-06-10 DIAGNOSIS — M5116 Intervertebral disc disorders with radiculopathy, lumbar region: Secondary | ICD-10-CM

## 2014-06-10 DIAGNOSIS — M545 Low back pain: Secondary | ICD-10-CM

## 2014-06-10 NOTE — Therapy (Signed)
[facility] [address] [place], [place], [address] Phone: [phone]   Fax:  [phone]  Physical Therapy Treatment  Patient Details  Name: Brooke BouchardVictoria Cruz MRN: [id number] Date of Birth: 1970/07/02 Referring Provider:  Callie FieldingBartko, Albert, MD  Encounter Date: 06/10/2014    Past Medical History  Diagnosis Date  . Pregnant   . Depression     Past Surgical History  Procedure Laterality Date  . Cesarean section    . Nasal sinus surgery      There were no vitals taken for this visit.  Visit Diagnosis:  Pain in neck  Bilateral low back pain, with sciatica presence unspecified  Lumbar disc disease with radiculopathy      Subjective Assessment - 06/10/14 1559    Symptoms Feeling better than I was the other day.   Currently in Pain? Yes   Pain Score 3                     [facility] Adult PT Treatment/Exercise - 06/10/14 0001    Exercises   Exercises Lumbar   Lumbar Exercises: Aerobic   Stationary Bike 6 minutes  Nustep level 6   Lumbar Exercises: Machines for Strengthening   Cybex Knee Extension 15  2x15   Cybex Knee Flexion 25#  2x15   Lumbar Exercises: Standing   Other Standing Lumbar Exercises bilateral hip ext  2x15   Lumbar Exercises: Supine   Bridge 10 reps  2 sets   Straight Leg Raise 10 reps  2 sets   Other Supine Lumbar Exercises KTC, rotation with ball  2x15   Modalities   Modalities Traction;Moist Heat;Electrical Stimulation   Moist Heat Therapy   Number Minutes Moist Heat 15 Minutes   Moist Heat Location Other (comment)  lumbar   Electrical Stimulation   Electrical Stimulation Location lumbar   Electrical Stimulation Parameters IFC   Electrical Stimulation Goals Pain   Traction   Type of Traction Lumbar   Min (lbs) 60   Max (lbs) 60   Hold Time static   Time 15 min.                  PT Short Term Goals - 06/04/14 1338    PT SHORT TERM GOAL #1   Title  be independent with initial HEP   Time 4   Period Weeks   Status Achieved           PT Long Term Goals - 06/06/14 1643    PT LONG TERM GOAL #1   Title demonstrate and/or verbalize techniques to reduce the risk of re-injury to include info on: posture, body mechanics, lifting   Time 8   Period Weeks   Status Achieved   PT LONG TERM GOAL #2   Title be independent with advanced HEP   Time 8   Period Weeks   Status On-going   PT LONG TERM GOAL #3   Title report pain decrease 50%   Baseline 5/10   Time 8   Period Weeks   Status On-going   PT LONG TERM GOAL #4   Title increase ROM 25%   Time 8   Period Weeks   Status Achieved   PT LONG TERM GOAL #5   Title lift child without extra pain   Time 8   Period Weeks   Status On-going               Problem List There  are no active problems to display for this patient.   Tyra Gural PTA 06/10/2014, 5:26 PM  [facility] [place] [facility] [address] [place], [place], [address] Phone: [phone]   Fax:  [phone]

## 2014-06-13 ENCOUNTER — Ambulatory Visit: Payer: Medicaid Other | Admitting: Physical Therapy

## 2014-06-13 ENCOUNTER — Encounter: Payer: Self-pay | Admitting: Physical Therapy

## 2014-06-13 DIAGNOSIS — M545 Low back pain: Secondary | ICD-10-CM

## 2014-06-13 DIAGNOSIS — M542 Cervicalgia: Secondary | ICD-10-CM | POA: Diagnosis not present

## 2014-06-13 DIAGNOSIS — M5116 Intervertebral disc disorders with radiculopathy, lumbar region: Secondary | ICD-10-CM

## 2014-06-13 NOTE — Therapy (Signed)
[facility] [address] [place], [place], [address] Phone: [phone]   Fax:  [phone]  Physical Therapy Treatment  Patient Details  Name: Brooke BouchardVictoria Cruz MRN: [id number] Date of Birth: [birth year] Referring Provider:  Callie FieldingBartko, Albert, MD  Encounter Date: 06/13/2014    Past Medical History  Diagnosis Date  . Pregnant   . Depression     Past Surgical History  Procedure Laterality Date  . Cesarean section    . Nasal sinus surgery      There were no vitals filed for this visit.  Visit Diagnosis:  Pain in neck  Bilateral low back pain, with sciatica presence unspecified  Lumbar disc disease with radiculopathy      Subjective Assessment - 06/13/14 1505    Symptoms Pretty good.   Currently in Pain? Yes   Pain Score 3    Pain Location Back   Pain Orientation Lower   Multiple Pain Sites No            [facility] PT Assessment - 06/13/14 0001    ROM / Strength   AROM / PROM / Strength AROM   AROM   AROM Assessment Site Lumbar   Lumbar Flexion --  decreased 25%   Lumbar Extension WFL's   Lumbar - Right Side Bend decreased 25%   Lumbar - Left Side Bend decreased 50%   Lumbar - Right Rotation decreased 50%   Lumbar - Left Rotation decreased 50%                   [facility] Adult PT Treatment/Exercise - 06/13/14 0001    Exercises   Exercises Lumbar   Lumbar Exercises: Aerobic   Stationary Bike 6 minutes  Nustep level 6   Lumbar Exercises: Machines for Strengthening   Leg Press 40#  2x15   Lumbar Exercises: Standing   Other Standing Lumbar Exercises 5# pull to midline  2x15 bilateral   Other Standing Lumbar Exercises ball vs wall into ext  yellow ball 2x10   Lumbar Exercises: Seated   Long Arc Quad on Chief LakeBall 2 sets;10 reps   LAQ on Smithfield FoodsBall Weights (lbs) 2   Hip Flexion on Ball 10 reps  2# 2x10   Other Seated Lumbar Exercises row 20#  2x15   Modalities   Modalities Traction   Moist  Heat Therapy   Number Minutes Moist Heat --   Moist Heat Location --   Insurance claims handlerlectrical Stimulation   Electrical Stimulation Location --   Electrical Stimulation Parameters --   Electrical Stimulation Goals --   Traction   Type of Traction Lumbar   Min (lbs) 60   Max (lbs) 60   Hold Time static   Time 15 min.                  PT Short Term Goals - 06/04/14 1338    PT SHORT TERM GOAL #1   Title be independent with initial HEP   Time 4   Period Weeks   Status Achieved           PT Long Term Goals - 06/13/14 1511    PT LONG TERM GOAL #1   Title demonstrate and/or verbalize techniques to reduce the risk of re-injury to include info on: posture, body mechanics, lifting   Time 8   Period Weeks   Status Achieved   PT LONG TERM GOAL #2   Time 8   Period Weeks   Status On-going  PT LONG TERM GOAL #3   Title report pain decrease 50%   Baseline 5/10   Time 8   Period Weeks   Status On-going   PT LONG TERM GOAL #4   Title increase ROM 25%   Time 8   Period Weeks   Status Achieved   PT LONG TERM GOAL #5   Title lift child without extra pain   Time 8   Period Weeks   Status On-going   PT LONG TERM GOAL #6   Title tolerate standing for 20 minutes   Time 8   Period Weeks   Status On-going               Plan - 06/13/14 1540    Clinical Impression Statement Difficulty with left side bend and rotation. Pain is decreasing.   Rehab Potential Excellent   PT Frequency 2x / week   PT Duration 8 weeks   PT Treatment/Interventions Electrical Stimulation;Moist Heat;Traction;Ultrasound;Functional mobility training;Therapeutic activities;Therapeutic exercise;Patient/family education;Manual techniques;Passive range of motion   PT Next Visit Plan Continue to strengthen and stabilize for decreased pain.   Consulted and Agree with Plan of Care Patient        Problem List There are no active problems to display for this patient.   Tinleigh Whitmire PTA 06/13/2014,  3:53 PM  [facility] [address] [place], [place], [address] Phone: [phone]   Fax:  [phone]

## 2014-06-17 ENCOUNTER — Ambulatory Visit: Payer: Medicaid Other | Admitting: Physical Therapy

## 2014-06-17 ENCOUNTER — Encounter: Payer: Self-pay | Admitting: Physical Therapy

## 2014-06-17 DIAGNOSIS — M542 Cervicalgia: Secondary | ICD-10-CM | POA: Diagnosis not present

## 2014-06-17 DIAGNOSIS — M545 Low back pain: Secondary | ICD-10-CM

## 2014-06-17 DIAGNOSIS — M5116 Intervertebral disc disorders with radiculopathy, lumbar region: Secondary | ICD-10-CM

## 2014-06-17 NOTE — Therapy (Signed)
[facility] [address] [place], [place], [address] Phone: [phone]   Fax:  [phone]  Physical Therapy Treatment  Patient Details  Name: Brooke Cruz MRN: [id number] Date of Birth: 1971-02-11 Referring Provider:  Callie Fielding, MD  Encounter Date: 06/17/2014      PT End of Session - 06/17/14 1601    Visit Number 13   Number of Visits 16   Date for PT Re-Evaluation 07/01/14   PT Start Time 1454   PT Stop Time 1555   PT Time Calculation (min) 61 min   Activity Tolerance Patient limited by pain   Behavior During Therapy [facility] for tasks assessed/performed      Past Medical History  Diagnosis Date  . Pregnant   . Depression     Past Surgical History  Procedure Laterality Date  . Cesarean section    . Nasal sinus surgery      There were no vitals filed for this visit.  Visit Diagnosis:  Pain in neck  Bilateral low back pain, with sciatica presence unspecified  Lumbar disc disease with radiculopathy      Subjective Assessment - 06/17/14 1452    Symptoms My legs don't work very good today.   Currently in Pain? Yes   Pain Score 5    Pain Location Back   Pain Orientation Lower                       [facility] Adult PT Treatment/Exercise - 06/17/14 0001    Exercises   Exercises Lumbar   Lumbar Exercises: Aerobic   Stationary Bike 6 minutes  Nustep level 6   Lumbar Exercises: Standing   Other Standing Lumbar Exercises 5# pull to midline   Modalities   Modalities Electrical Stimulation;Moist Heat;Traction   Moist Heat Therapy   Number Minutes Moist Heat 15 Minutes   Moist Heat Location Other (comment)  lumbar   Electrical Stimulation   Electrical Stimulation Location lumbar   Electrical Stimulation Parameters IFC   Electrical Stimulation Goals Pain   Traction   Type of Traction Lumbar   Min (lbs) 65   Max (lbs) 65   Hold Time static   Time 15 min.   Manual Therapy   Manual Therapy Myofascial release                  PT Short Term Goals - 06/04/14 1338    PT SHORT TERM GOAL #1   Title be independent with initial HEP   Time 4   Period Weeks   Status Achieved           PT Long Term Goals - 06/13/14 1511    PT LONG TERM GOAL #1   Title demonstrate and/or verbalize techniques to reduce the risk of re-injury to include info on: posture, body mechanics, lifting   Time 8   Period Weeks   Status Achieved   PT LONG TERM GOAL #2   Time 8   Period Weeks   Status On-going   PT LONG TERM GOAL #3   Title report pain decrease 50%   Baseline 5/10   Time 8   Period Weeks   Status On-going   PT LONG TERM GOAL #4   Title increase ROM 25%   Time 8   Period Weeks   Status Achieved   PT LONG TERM GOAL #5   Title lift child without extra pain   Time 8  Period Weeks   Status On-going   PT LONG TERM GOAL #6   Title tolerate standing for 20 minutes   Time 8   Period Weeks   Status On-going               Plan - 06/17/14 1624    Clinical Impression Statement Very tight in left lumbar.   Pt will benefit from skilled therapeutic intervention in order to improve on the following deficits Decreased mobility;Decreased range of motion;Decreased strength;Pain   Rehab Potential Excellent   PT Frequency 2x / week   PT Duration 8 weeks   PT Treatment/Interventions Electrical Stimulation;Moist Heat;Traction;Ultrasound;Functional mobility training;Therapeutic activities;Therapeutic exercise;Patient/family education;Manual techniques;Passive range of motion   PT Next Visit Plan Continue to strengthen and stabilize for decreased pain.   Consulted and Agree with Plan of Care Patient        Problem List There are no active problems to display for this patient.   Brooke Cruz PTA 06/17/2014, 4:29 PM  [facility] [address] [place], [place], [address] Phone: [phone]    Fax:  [phone]

## 2014-06-19 ENCOUNTER — Encounter: Payer: Self-pay | Admitting: Physical Therapy

## 2014-06-19 ENCOUNTER — Ambulatory Visit: Payer: Medicaid Other | Admitting: Physical Therapy

## 2014-06-19 DIAGNOSIS — M5116 Intervertebral disc disorders with radiculopathy, lumbar region: Secondary | ICD-10-CM

## 2014-06-19 DIAGNOSIS — M542 Cervicalgia: Secondary | ICD-10-CM

## 2014-06-19 DIAGNOSIS — M545 Low back pain: Secondary | ICD-10-CM

## 2014-06-19 NOTE — Therapy (Signed)
[facility] [address] [place], [place], [address] Phone: [phone]   Fax:  [phone]  Physical Therapy Treatment  Patient Details  Name: Brooke BouchardVictoria Seiple MRN: [id number] Date of Birth: [birth year] Referring Provider:  Callie FieldingBartko, Albert, MD  Encounter Date: 06/19/2014      PT End of Session - 06/19/14 1615    Visit Number 14   Number of Visits 16   Date for PT Re-Evaluation 07/01/14   PT Start Time 1450   PT Stop Time 1548   PT Time Calculation (min) 58 min   Activity Tolerance Patient limited by pain   Behavior During Therapy [facility] for tasks assessed/performed      Past Medical History  Diagnosis Date  . Pregnant   . Depression     Past Surgical History  Procedure Laterality Date  . Cesarean section    . Nasal sinus surgery      There were no vitals filed for this visit.  Visit Diagnosis:  Bilateral low back pain, with sciatica presence unspecified  Lumbar disc disease with radiculopathy  Pain in neck      Subjective Assessment - 06/19/14 1451    Symptoms Doing ok.   Currently in Pain? Yes   Pain Score 4    Multiple Pain Sites No            [facility] PT Assessment - 06/19/14 0001    ROM / Strength   AROM / PROM / Strength AROM   AROM   AROM Assessment Site Lumbar   Lumbar Flexion decreased 25%   Lumbar Extension WFL's   Lumbar - Right Side Bend decreased 25%   Lumbar - Left Side Bend decreased 50%   Lumbar - Right Rotation decreased 50%   Lumbar - Left Rotation decreased 50%                   [facility] Adult PT Treatment/Exercise - 06/19/14 0001    Exercises   Exercises Lumbar   Lumbar Exercises: Aerobic   Stationary Bike 6 minutes  Nustep level 6   Lumbar Exercises: Seated   Other Seated Lumbar Exercises row 20#  2x15   Modalities   Modalities Electrical Stimulation;Moist Heat;Traction;Ultrasound   Moist Heat Therapy   Number Minutes Moist Heat 15 Minutes   Moist  Heat Location Other (comment)  lumbar   Electrical Stimulation   Electrical Stimulation Location lumbar   Electrical Stimulation Parameters IFC   Electrical Stimulation Goals Pain   Ultrasound   Ultrasound Location left lumbar   Ultrasound Parameters 1.140mHz;1.3 w/cm2   Ultrasound Goals Pain   Traction   Type of Traction Lumbar   Min (lbs) 65   Max (lbs) 65   Hold Time static   Time 15 min.                  PT Short Term Goals - 06/04/14 1338    PT SHORT TERM GOAL #1   Title be independent with initial HEP   Time 4   Period Weeks   Status Achieved           PT Long Term Goals - 06/13/14 1511    PT LONG TERM GOAL #1   Title demonstrate and/or verbalize techniques to reduce the risk of re-injury to include info on: posture, body mechanics, lifting   Time 8   Period Weeks   Status Achieved   PT LONG TERM GOAL #2   Time 8  Period Weeks   Status On-going   PT LONG TERM GOAL #3   Title report pain decrease 50%   Baseline 5/10   Time 8   Period Weeks   Status On-going   PT LONG TERM GOAL #4   Title increase ROM 25%   Time 8   Period Weeks   Status Achieved   PT LONG TERM GOAL #5   Title lift child without extra pain   Time 8   Period Weeks   Status On-going   PT LONG TERM GOAL #6   Title tolerate standing for 20 minutes   Time 8   Period Weeks   Status On-going               Problem List There are no active problems to display for this patient.   Azyria Osmon PTA 06/19/2014, 4:16 PM  [facility] [address] [place], [place], [address] Phone: [phone]   Fax:  [phone]

## 2014-06-26 ENCOUNTER — Ambulatory Visit: Payer: Medicaid Other | Admitting: Physical Therapy

## 2014-06-26 DIAGNOSIS — M542 Cervicalgia: Secondary | ICD-10-CM

## 2014-06-26 DIAGNOSIS — M545 Low back pain: Secondary | ICD-10-CM

## 2014-06-26 DIAGNOSIS — M5116 Intervertebral disc disorders with radiculopathy, lumbar region: Secondary | ICD-10-CM

## 2014-06-26 NOTE — Therapy (Signed)
[facility] [address] [place], [place], [address] Phone: [phone]   Fax:  [phone]  Physical Therapy Treatment  Patient Details  Name: Brooke Cruz MRN: [id number] Date of Birth: 01-28-1971 Referring Provider:  Callie Fielding, MD  Encounter Date: 06/26/2014      PT End of Session - 06/26/14 1212    PT Start Time 1110   PT Stop Time 1212   PT Time Calculation (min) 62 min   Activity Tolerance Patient limited by pain      Past Medical History  Diagnosis Date  . Pregnant   . Depression     Past Surgical History  Procedure Laterality Date  . Cesarean section    . Nasal sinus surgery      There were no vitals filed for this visit.  Visit Diagnosis:  Bilateral low back pain, with sciatica presence unspecified  Lumbar disc disease with radiculopathy  Pain in neck      Subjective Assessment - 06/26/14 1118    Symptoms Hurting today.   Currently in Pain? Yes   Pain Score 6    Pain Location Back   Pain Orientation Lower   Multiple Pain Sites No            [facility] PT Assessment - 06/26/14 0001    ROM / Strength   AROM / PROM / Strength AROM   AROM   AROM Assessment Site Lumbar   Lumbar Flexion decreased 25%   Lumbar Extension WFL's   Lumbar - Right Side Bend decreased 25%   Lumbar - Left Side Bend decreased 50%   Lumbar - Right Rotation decreased 50%   Lumbar - Left Rotation decreased 50%                   [facility] Adult PT Treatment/Exercise - 06/26/14 0001    Exercises   Exercises Lumbar   Lumbar Exercises: Aerobic   Stationary Bike 6 minutes  Nustep level 6   Modalities   Modalities Electrical Stimulation;Moist Heat;Ultrasound   Moist Heat Therapy   Moist Heat Location Other (comment)  lumbar   Electrical Stimulation   Electrical Stimulation Location lumbar   Electrical Stimulation Parameters IFC   Electrical Stimulation Goals Pain   Ultrasound   Ultrasound  Location lumbar   Ultrasound Parameters 1.0 mHz; 1.5 w/cm2   Ultrasound Goals Pain   Manual Therapy   Manual Therapy Myofascial release                  PT Short Term Goals - 06/04/14 1338    PT SHORT TERM GOAL #1   Title be independent with initial HEP   Time 4   Period Weeks   Status Achieved           PT Long Term Goals - 06/13/14 1511    PT LONG TERM GOAL #1   Title demonstrate and/or verbalize techniques to reduce the risk of re-injury to include info on: posture, body mechanics, lifting   Time 8   Period Weeks   Status Achieved   PT LONG TERM GOAL #2   Time 8   Period Weeks   Status On-going   PT LONG TERM GOAL #3   Title report pain decrease 50%   Baseline 5/10   Time 8   Period Weeks   Status On-going   PT LONG TERM GOAL #4   Title increase ROM 25%   Time 8   Period  Weeks   Status Achieved   PT LONG TERM GOAL #5   Title lift child without extra pain   Time 8   Period Weeks   Status On-going   PT LONG TERM GOAL #6   Title tolerate standing for 20 minutes   Time 8   Period Weeks   Status On-going               Problem List There are no active problems to display for this patient.   Abrey Bradway PTA 06/26/2014, 12:17 PM  [facility] [address] [place], [place], [address] Phone: [phone]   Fax:  [phone]

## 2014-07-01 ENCOUNTER — Ambulatory Visit: Payer: Medicaid Other | Admitting: Physical Therapy

## 2014-07-05 ENCOUNTER — Ambulatory Visit: Payer: Medicaid Other | Attending: Physical Medicine and Rehabilitation | Admitting: Physical Therapy

## 2014-07-05 DIAGNOSIS — M542 Cervicalgia: Secondary | ICD-10-CM | POA: Insufficient documentation

## 2014-07-05 DIAGNOSIS — M5116 Intervertebral disc disorders with radiculopathy, lumbar region: Secondary | ICD-10-CM

## 2014-07-05 DIAGNOSIS — M545 Low back pain: Secondary | ICD-10-CM

## 2014-07-05 NOTE — Therapy (Signed)
[facility] [address] [place], [place], [address] Phone: [phone]   Fax:  [phone]  Physical Therapy Treatment  Patient Details  Name: Brooke BouchardVictoria Cruz MRN: [id number] Date of Birth: 01/24/1971 Referring Provider:  Callie FieldingBartko, Albert, MD  Encounter Date: 07/05/2014      PT End of Session - 07/05/14 1144    Visit Number 16   PT Start Time 0850   PT Stop Time 0945   PT Time Calculation (min) 55 min   Activity Tolerance Patient tolerated treatment well;Patient limited by pain      Past Medical History  Diagnosis Date   Pregnant    Depression     Past Surgical History  Procedure Laterality Date   Cesarean section     Nasal sinus surgery      There were no vitals filed for this visit.  Visit Diagnosis:  Bilateral low back pain, with sciatica presence unspecified  Lumbar disc disease with radiculopathy  Pain in neck      Subjective Assessment - 07/05/14 1137    Symptoms Cont with Left Low backpain.  Today is having Right hip pain also   Pain Score 5    Pain Location Back   Pain Orientation Lower;Left                       [facility] Adult PT Treatment/Exercise - 07/05/14 0001    Lumbar Exercises: Stretches   Single Knee to Chest Stretch 5 reps;10 seconds   Double Knee to Chest Stretch 5 reps;10 seconds   Lower Trunk Rotation 5 reps;10 seconds   Pelvic Tilt --  10 reps; 5 seconds   Lumbar Exercises: Standing   Other Standing Lumbar Exercises 5# pull to midline   Other Standing Lumbar Exercises standing quadratus stretch  standing quadratus stetch   Lumbar Exercises: Supine   Ab Set 5 reps;Other (comment)  20 second hold   Bridge 10 reps;5 seconds   Ultrasound   Ultrasound Location lumbar   Ultrasound Parameters 1.390mHz;1.3w/cm2   Ultrasound Goals Pain                  PT Short Term Goals - 07/05/14 1142    PT SHORT TERM GOAL #1   Title be independent with  initial HEP   Time 4   Period Weeks   Status Achieved           PT Long Term Goals - 07/05/14 1143    PT LONG TERM GOAL #1   Title demonstrate and/or verbalize techniques to reduce the risk of re-injury to include info on: posture, body mechanics, lifting   Time 8   Period Weeks   Status Achieved   PT LONG TERM GOAL #2   Time 8   Period Weeks   Status On-going   PT LONG TERM GOAL #3   Title report pain decrease 50%   Baseline 5/10   Time 8   Period Weeks   Status On-going   PT LONG TERM GOAL #4   Title increase ROM 25%   Time 8   Period Weeks   Status Achieved   PT LONG TERM GOAL #5   Title lift child without extra pain   Time 8   Period Weeks   Status On-going   PT LONG TERM GOAL #6   Title tolerate standing for 20 minutes   Time 8   Period Weeks   Status On-going  Plan - 07/05/14 1144    Clinical Impression Statement Limited by pain.  Did well with lumbopelvic stabilization   Pt will benefit from skilled therapeutic intervention in order to improve on the following deficits Decreased strength   Rehab Potential Good   PT Frequency 2x / week   PT Duration 3 weeks   Consulted and Agree with Plan of Care Patient        Problem List There are no active problems to display for this patient.   Tomie China, PTA 07/05/2014, 11:48 AM  [facility] [address] [place], [place], [address] Phone: [phone]   Fax:  [phone]

## 2014-07-09 ENCOUNTER — Ambulatory Visit: Payer: Medicaid Other | Admitting: Physical Therapy

## 2014-07-09 DIAGNOSIS — M5116 Intervertebral disc disorders with radiculopathy, lumbar region: Secondary | ICD-10-CM

## 2014-07-09 DIAGNOSIS — M542 Cervicalgia: Secondary | ICD-10-CM

## 2014-07-09 DIAGNOSIS — M545 Low back pain: Secondary | ICD-10-CM

## 2014-07-09 NOTE — Therapy (Signed)
[facility] [address] [place], [place], [address] Phone: [phone]   Fax:  [phone]  Physical Therapy Treatment  Patient Details  Name: Suzy BouchardVictoria Versteeg MRN: [id number] Date of Birth: 1970-12-06 Referring Provider:  Callie FieldingBartko, Albert, MD  Encounter Date: 07/09/2014      PT End of Session - 07/09/14 1543    Visit Number 17   Date for PT Re-Evaluation 08/01/14   PT Start Time 1445   PT Stop Time 1549   PT Time Calculation (min) 64 min   Activity Tolerance Patient tolerated treatment well   Behavior During Therapy [facility] for tasks assessed/performed      Past Medical History  Diagnosis Date  . Pregnant   . Depression     Past Surgical History  Procedure Laterality Date  . Cesarean section    . Nasal sinus surgery      There were no vitals filed for this visit.  Visit Diagnosis:  Bilateral low back pain, with sciatica presence unspecified  Lumbar disc disease with radiculopathy  Pain in neck      Subjective Assessment - 07/09/14 1454    Subjective I'm doing alot better.   Currently in Pain? Yes   Pain Score 2                        [facility] Adult PT Treatment/Exercise - 07/09/14 0001    Exercises   Exercises Lumbar   Lumbar Exercises: Aerobic   Elliptical Nustep level 6x6 minutes   Lumbar Exercises: Standing   Other Standing Lumbar Exercises 10# pull to midline  2x10 bilateral   Modalities   Modalities Moist Heat;Ultrasound   Moist Heat Therapy   Number Minutes Moist Heat 20 Minutes   Moist Heat Location Other (comment)  lumbar   Ultrasound   Ultrasound Location lumbar   Ultrasound Parameters 1.920mHz;1.2 w/cm2   Ultrasound Goals Pain   Manual Therapy   Manual Therapy Myofascial release   Myofascial Release lumbar                  PT Short Term Goals - 07/05/14 1142    PT SHORT TERM GOAL #1   Title be independent with initial HEP   Time 4   Period Weeks   Status Achieved           PT Long Term Goals - 07/09/14 1541    PT LONG TERM GOAL #1   Title demonstrate and/or verbalize techniques to reduce the risk of re-injury to include info on: posture, body mechanics, lifting   Time 8   Period Weeks   Status Achieved   PT LONG TERM GOAL #2   Title be independent with advanced HEP   Time 8   Period Weeks   Status On-going   PT LONG TERM GOAL #3   Title report pain decrease 50%   Baseline 5/10   Period Weeks   Status Achieved   PT LONG TERM GOAL #4   Title increase ROM 25%   Time 8   Period Weeks   Status Achieved   PT LONG TERM GOAL #5   Title lift child without extra pain   Period Weeks   Status Achieved   PT LONG TERM GOAL #6   Title tolerate standing for 20 minutes   Time 8   Period Weeks   Status Achieved  Problem List There are no active problems to display for this patient.   Dacey Milberger PTA 07/09/2014, 3:49 PM  [facility] [place] [address] [place], [place], [address] Phone: [phone]   Fax:  [phone]

## 2014-07-12 ENCOUNTER — Ambulatory Visit: Payer: Medicaid Other | Admitting: Physical Therapy

## 2014-07-15 ENCOUNTER — Ambulatory Visit: Payer: Medicaid Other | Admitting: Physical Therapy

## 2014-07-15 DIAGNOSIS — M545 Low back pain: Secondary | ICD-10-CM

## 2014-07-15 DIAGNOSIS — M542 Cervicalgia: Secondary | ICD-10-CM | POA: Diagnosis not present

## 2014-07-15 DIAGNOSIS — M5116 Intervertebral disc disorders with radiculopathy, lumbar region: Secondary | ICD-10-CM

## 2014-07-15 NOTE — Therapy (Signed)
[facility] [place] [address] [place], [place], [address] Phone: [phone]   Fax:  [phone]  Physical Therapy Treatment  Patient Details  Name: Brooke Cruz MRN: [id number] Date of Birth: [birth year] Referring Provider:  Callie Fielding, MD  Encounter Date: 07/15/2014      PT End of Session - 07/15/14 1402    Visit Number 18   Date for PT Re-Evaluation 08/01/14   PT Start Time 1315   PT Stop Time 1414   PT Time Calculation (min) 59 min   Activity Tolerance Patient tolerated treatment well   Behavior During Therapy [facility] for tasks assessed/performed      Past Medical History  Diagnosis Date  . Pregnant   . Depression     Past Surgical History  Procedure Laterality Date  . Cesarean section    . Nasal sinus surgery      There were no vitals filed for this visit.  Visit Diagnosis:  Bilateral low back pain, with sciatica presence unspecified  Lumbar disc disease with radiculopathy      Subjective Assessment - 07/15/14 1317    Subjective Feeling better; had injection on Wednesday so decreased pain today.     Currently in Pain? Yes   Pain Score 2    Pain Location Back   Pain Orientation Lower;Right   Pain Onset More than a month ago                       [facility] Adult PT Treatment/Exercise - 07/15/14 1318    Lumbar Exercises: Stretches   Single Knee to Chest Stretch 3 reps;30 seconds   Lower Trunk Rotation 3 reps;30 seconds   Lumbar Exercises: Aerobic   Stationary Bike NuStep level 5 x 8 min   Lumbar Exercises: Standing   Other Standing Lumbar Exercises 10# pull to midline 2x10 bil   Other Standing Lumbar Exercises standing quadratus stretch 3x30 sec bil   Moist Heat Therapy   Number Minutes Moist Heat 15 Minutes   Moist Heat Location Other (comment)  low back   Ultrasound   Ultrasound Location lumbar   Ultrasound Parameters 1.20mHz; 100% DC, 1.2 w/cm2 x 8 min   Ultrasound Goals  Pain                  PT Short Term Goals - 07/05/14 1142    PT SHORT TERM GOAL #1   Title be independent with initial HEP   Time 4   Period Weeks   Status Achieved           PT Long Term Goals - 07/09/14 1541    PT LONG TERM GOAL #1   Title demonstrate and/or verbalize techniques to reduce the risk of re-injury to include info on: posture, body mechanics, lifting   Time 8   Period Weeks   Status Achieved   PT LONG TERM GOAL #2   Title be independent with advanced HEP   Time 8   Period Weeks   Status On-going   PT LONG TERM GOAL #3   Title report pain decrease 50%   Baseline 5/10   Period Weeks   Status Achieved   PT LONG TERM GOAL #4   Title increase ROM 25%   Time 8   Period Weeks   Status Achieved   PT LONG TERM GOAL #5   Title lift child without extra pain   Period Weeks   Status Achieved  PT LONG TERM GOAL #6   Title tolerate standing for 20 minutes   Time 8   Period Weeks   Status Achieved               Plan - 07/15/14 1402    Clinical Impression Statement Pain continuing to improve and able to tolerate increased exercise today.   PT Next Visit Plan Continue to strengthen and stabilize for decreased pain.        Problem List There are no active problems to display for this patient.  Clarita CraneStephanie F Taryn Nave, PT, DPT 07/15/2014 2:16 PM  [facility] [address] [address] [place], [place], [address] Phone: [phone]   Fax:  [phone]

## 2014-07-18 ENCOUNTER — Ambulatory Visit: Payer: Medicaid Other | Admitting: Physical Therapy

## 2014-07-18 DIAGNOSIS — M542 Cervicalgia: Secondary | ICD-10-CM | POA: Diagnosis not present

## 2014-07-18 DIAGNOSIS — M5116 Intervertebral disc disorders with radiculopathy, lumbar region: Secondary | ICD-10-CM

## 2014-07-18 DIAGNOSIS — M545 Low back pain: Secondary | ICD-10-CM

## 2014-07-18 NOTE — Therapy (Signed)
[facility] [place] [address] [place], [place], [address] Phone: [phone]   Fax:  [phone]  Physical Therapy Treatment  Patient Details  Name: Brooke Cruz MRN: [id number] Date of Birth: September 25, 1970 Referring Provider:  Callie Fielding, MD  Encounter Date: 07/18/2014      PT End of Session - 07/18/14 1441    Visit Number 19   Number of Visits 16   Date for PT Re-Evaluation 08/01/14   PT Start Time 1408   PT Stop Time 1500   PT Time Calculation (min) 52 min      Past Medical History  Diagnosis Date  . Pregnant   . Depression     Past Surgical History  Procedure Laterality Date  . Cesarean section    . Nasal sinus surgery      There were no vitals filed for this visit.  Visit Diagnosis:  Bilateral low back pain, with sciatica presence unspecified  Lumbar disc disease with radiculopathy      Subjective Assessment - 07/18/14 1409    Subjective burning pain Left leg and upper posterior thigh. Low back pain is better.   Limitations House hold activities;Standing;Walking   How long can you sit comfortably? 15 minutes   How long can you stand comfortably? 20-25 minutes   How long can you walk comfortably? 30 minutes   Currently in Pain? Yes   Pain Score 3    Pain Location Leg   Pain Orientation Left   Pain Type Chronic pain   Pain Onset More than a month ago                       [facility] Adult PT Treatment/Exercise - 07/18/14 0001    Lumbar Exercises: Stretches   Active Hamstring Stretch 2 reps;60 seconds   Quad Stretch 2 reps  adductor stretch bilatera   Piriformis Stretch 2 reps;30 seconds   Lumbar Exercises: Supine   Dead Bug 10 reps;3 seconds;5 seconds   Bridge 10 reps;5 seconds   Other Supine Lumbar Exercises LTR   Other Supine Lumbar Exercises SKTC, DKTC continueous motion   Lumbar Exercises: Prone   Single Arm Raise 5 reps   Other Prone Lumbar Exercises POE 2 min.  intervals   Other Prone Lumbar Exercises Press ups 5sec holds X10   Lumbar Exercises: Quadruped   Other Quadruped Lumbar Exercises UE & LE alternate lifts then childs pose rest                  PT Short Term Goals - 07/05/14 1142    PT SHORT TERM GOAL #1   Title be independent with initial HEP   Time 4   Period Weeks   Status Achieved           PT Long Term Goals - 07/09/14 1541    PT LONG TERM GOAL #1   Title demonstrate and/or verbalize techniques to reduce the risk of re-injury to include info on: posture, body mechanics, lifting   Time 8   Period Weeks   Status Achieved   PT LONG TERM GOAL #2   Title be independent with advanced HEP   Time 8   Period Weeks   Status On-going   PT LONG TERM GOAL #3   Title report pain decrease 50%   Baseline 5/10   Period Weeks   Status Achieved   PT LONG TERM GOAL #4   Title increase ROM 25%  Time 8   Period Weeks   Status Achieved   PT LONG TERM GOAL #5   Title lift child without extra pain   Period Weeks   Status Achieved   PT LONG TERM GOAL #6   Title tolerate standing for 20 minutes   Time 8   Period Weeks   Status Achieved               Plan - 07/18/14 1442    Clinical Impression Statement Pain was much better end of session after stretching and stabilization exercises   Pt will benefit from skilled therapeutic intervention in order to improve on the following deficits Decreased strength   Rehab Potential Good   PT Frequency 2x / week   PT Duration 3 weeks   PT Next Visit Plan Continue to strengthen and stabilize for decreased pain.   Consulted and Agree with Plan of Care Patient        Problem List There are no active problems to display for this patient.  Lady DeutscherSheila Parnell Aviela Blundell, PTA      07/18/2014, 2:47 PM  [facility] [place] [address] [place], [place], [address] Phone: [phone]   Fax:  [phone]

## 2014-07-22 ENCOUNTER — Ambulatory Visit: Payer: Medicaid Other | Admitting: Physical Therapy

## 2014-07-22 DIAGNOSIS — M545 Low back pain: Secondary | ICD-10-CM

## 2014-07-22 DIAGNOSIS — M542 Cervicalgia: Secondary | ICD-10-CM

## 2014-07-22 DIAGNOSIS — M5116 Intervertebral disc disorders with radiculopathy, lumbar region: Secondary | ICD-10-CM

## 2014-07-22 NOTE — Therapy (Signed)
[facility] [place] [address] [place], [place], [address] Phone: [phone]   Fax:  [phone]  Physical Therapy Treatment  Patient Details  Name: Brooke Cruz MRN: [id number] Date of Birth: [birth year] Referring Provider:  Callie Fielding, MD  Encounter Date: 07/22/2014      PT End of Session - 07/22/14 1441    Visit Number 20   Date for PT Re-Evaluation 08/01/14   PT Start Time 1407   PT Stop Time 1445   PT Time Calculation (min) 38 min   Activity Tolerance Patient tolerated treatment well   Behavior During Therapy [facility] for tasks assessed/performed      Past Medical History  Diagnosis Date  . Pregnant   . Depression     Past Surgical History  Procedure Laterality Date  . Cesarean section    . Nasal sinus surgery      There were no vitals filed for this visit.  Visit Diagnosis:  Bilateral low back pain, with sciatica presence unspecified  Lumbar disc disease with radiculopathy  Pain in neck      Subjective Assessment - 07/22/14 1412    Subjective Feeling much better; only having pain at night.   Currently in Pain? No/denies                         [facility] Adult PT Treatment/Exercise - 07/22/14 1412    Lumbar Exercises: Stretches   Lower Trunk Rotation 10 seconds;5 reps   Lower Trunk Rotation Limitations bil   Lumbar Exercises: Aerobic   Elliptical Incline 5; Res 3 x 6 min; 3 min forward/3 min backward   Lumbar Exercises: Supine   Bent Knee Raise 20 reps   Bent Knee Raise Limitations alt; reciprocal motion   Other Supine Lumbar Exercises supine obliques x 10 bil   Lumbar Exercises: Sidelying   Other Sidelying Lumbar Exercises side planks 3x10 sec hold   Lumbar Exercises: Prone   Other Prone Lumbar Exercises POE 2 min. intervals; prone plank 3 x 10 sec   Lumbar Exercises: Quadruped   Opposite Arm/Leg Raise Right arm/Left leg;Left arm/Right leg;10 reps;3 seconds   Opposite  Arm/Leg Raise Limitations bil   Other Quadruped Lumbar Exercises hip abdct x 10 bil   Other Quadruped Lumbar Exercises childs pose rest between activities R/M/L                  PT Short Term Goals - 07/05/14 1142    PT SHORT TERM GOAL #1   Title be independent with initial HEP   Time 4   Period Weeks   Status Achieved           PT Long Term Goals - 07/09/14 1541    PT LONG TERM GOAL #1   Title demonstrate and/or verbalize techniques to reduce the risk of re-injury to include info on: posture, body mechanics, lifting   Time 8   Period Weeks   Status Achieved   PT LONG TERM GOAL #2   Title be independent with advanced HEP   Time 8   Period Weeks   Status On-going   PT LONG TERM GOAL #3   Title report pain decrease 50%   Baseline 5/10   Period Weeks   Status Achieved   PT LONG TERM GOAL #4   Title increase ROM 25%   Time 8   Period Weeks   Status Achieved   PT LONG TERM GOAL #5  Title lift child without extra pain   Period Weeks   Status Achieved   PT LONG TERM GOAL #6   Title tolerate standing for 20 minutes   Time 8   Period Weeks   Status Achieved               Plan - 07/22/14 1442    Clinical Impression Statement Pt tolerated session well today.  Plan to begin wrapping up with back and address neck next 1-2 sessions.     PT Next Visit Plan Continue to strengthen and stabilize for decreased pain.   Consulted and Agree with Plan of Care Patient        Problem List There are no active problems to display for this patient.  Clarita CraneStephanie F Kamesha Herne, PT, DPT 07/22/2014 2:47 PM  [facility] [address] [address] [place], [place], [address] Phone: [phone]   Fax:  [phone]

## 2014-07-25 ENCOUNTER — Ambulatory Visit: Payer: Medicaid Other | Admitting: Physical Therapy

## 2014-07-25 DIAGNOSIS — M5116 Intervertebral disc disorders with radiculopathy, lumbar region: Secondary | ICD-10-CM

## 2014-07-25 DIAGNOSIS — M542 Cervicalgia: Secondary | ICD-10-CM | POA: Diagnosis not present

## 2014-07-25 DIAGNOSIS — M545 Low back pain: Secondary | ICD-10-CM

## 2014-07-25 NOTE — Therapy (Signed)
[facility] [place] [address] [place], [place], [address] Phone: [phone]   Fax:  [phone]  Physical Therapy Treatment  Patient Details  Name: Brooke BouchardVictoria Cruz MRN: [id number] Date of Birth: [birth year] Referring Provider:  Callie FieldingBartko, Albert, MD  Encounter Date: 07/25/2014      PT End of Session - 07/25/14 1722    Visit Number 21   Number of Visits 16   Date for PT Re-Evaluation 08/01/14   PT Start Time 1706   PT Stop Time 1745   PT Time Calculation (min) 39 min   Activity Tolerance Patient tolerated treatment well   Behavior [facility] Therapy [facility] for tasks assessed/performed      Past Medical History  Diagnosis Date  . Pregnant   . Depression     Past Surgical History  Procedure Laterality Date  . Cesarean section    . Nasal sinus surgery      There were no vitals filed for this visit.  Visit Diagnosis:  Bilateral low back pain, with sciatica presence unspecified  Lumbar disc disease with radiculopathy      Subjective Assessment - 07/25/14 1706    Subjective feeling good   Limitations House hold activities;Standing;Walking   How long can you sit comfortably? 15 minutes   How long can you stand comfortably? 20-25 minutes   How long can you walk comfortably? 30 minutes   Currently in Pain? Yes   Pain Score 1    Pain Location Back   Pain Orientation Left   Pain Onset More than a month ago                         [facility] Adult PT Treatment/Exercise - 07/25/14 0001    Lumbar Exercises: Stretches   Active Hamstring Stretch 1 rep;30 seconds;Other (comment)  with strap    Single Knee to Chest Stretch 3 reps   Double Knee to Chest Stretch 3 reps   Double Knee to Chest Stretch Limitations 1`   Lumbar Exercises: Seated   Other Seated Lumbar Exercises sit to stand feet together, then R infront, then L infront   Lumbar Exercises: Supine   Clam 20 reps  bilateral   Dead Bug 10 reps   Bridge 10 reps  low lift to engage hams   Other Supine Lumbar Exercises Single leg lift with opposite leg in 90/90   Other Supine Lumbar Exercises obliques with knees together and hold 3 seconds each side with ball squeeze   Lumbar Exercises: Prone   Other Prone Lumbar Exercises hands on mat table plank with knee to opposite hand    Other Prone Lumbar Exercises side plank with forearm on mat table  30 seconds                  PT Short Term Goals - 07/05/14 1142    PT SHORT TERM GOAL #1   Title be independent with initial HEP   Time 4   Period Weeks   Status Achieved           PT Long Term Goals - 07/09/14 1541    PT LONG TERM GOAL #1   Title demonstrate and/or verbalize techniques to reduce the risk of re-injury to include info on: posture, body mechanics, lifting   Time 8   Period Weeks   Status Achieved   PT LONG TERM GOAL #2   Title be independent with advanced HEP   Time  8   Period Weeks   Status On-going   PT LONG TERM GOAL #3   Title report pain decrease 50%   Baseline 5/10   Period Weeks   Status Achieved   PT LONG TERM GOAL #4   Title increase ROM 25%   Time 8   Period Weeks   Status Achieved   PT LONG TERM GOAL #5   Title lift child without extra pain   Period Weeks   Status Achieved   PT LONG TERM GOAL #6   Title tolerate standing for 20 minutes   Time 8   Period Weeks   Status Achieved               Plan - 07/25/14 1723    Clinical Impression Statement pt did very well with new ab  exercises today for   Pt will benefit from skilled therapeutic intervention in order to improve on the following deficits Decreased strength   Rehab Potential Good   PT Duration 3 weeks   PT Treatment/Interventions Electrical Stimulation;Moist Heat;Traction;Ultrasound;Functional mobility training;Therapeutic activities;Therapeutic exercise;Patient/family education;Manual techniques;Passive range of motion   PT Next Visit Plan D/C next week if still  doing well.   Consulted and Agree with Plan of Care Patient        Problem List There are no active problems to display for this patient.    Lady Deutscher, PTA  07/25/2014, 5:39 PM  [facility] [place] [address] [place], [place], [address] Phone: [phone]   Fax:  [phone]

## 2014-07-26 DIAGNOSIS — E785 Hyperlipidemia, unspecified: Secondary | ICD-10-CM | POA: Insufficient documentation

## 2014-07-29 ENCOUNTER — Ambulatory Visit: Payer: Medicaid Other | Admitting: Physical Therapy

## 2014-07-29 DIAGNOSIS — M542 Cervicalgia: Secondary | ICD-10-CM | POA: Diagnosis not present

## 2014-07-29 NOTE — Therapy (Signed)
[facility] [place] [address] [place], [place], [address] Phone: [phone]   Fax:  [phone]  Physical Therapy Treatment  Patient Details  Name: Brooke Cruz MRN: [id number] Date of Birth: September 27, 1970 Referring Provider:  Callie FieldingBartko, Albert, MD  Encounter Date: 07/29/2014      PT End of Session - 07/29/14 1437    Visit Number 22   Number of Visits 16   Date for PT Re-Evaluation 08/01/14   PT Start Time 1353   PT Stop Time 1442   PT Time Calculation (min) 49 min      Past Medical History  Diagnosis Date  . Pregnant   . Depression     Past Surgical History  Procedure Laterality Date  . Cesarean section    . Nasal sinus surgery      There were no vitals filed for this visit.  Visit Diagnosis:  Pain in neck      Subjective Assessment - 07/29/14 1357    Subjective back is feeling alot better!!! no neck pain but stiff and upper back shoulder area bilateral.   Currently in Pain? No/denies                         [facility] Adult [facility] Treatment/Exercise - 07/29/14 0001    Exercises   Exercises Neck   Neck Exercises: Standing   Other Standing Exercises levator stretch on Left with back to wall with head turn to right and pocket gaze    Neck Exercises: Seated   Shoulder Shrugs 20 reps   Shoulder Rolls 20 reps;Backwards;Forwards   Modalities   Modalities Ultrasound   Ultrasound   Ultrasound Location bilateral upper traps    Ultrasound Parameters 1.0 mHz, 1.3w/cm2   Ultrasound Goals Other (Comment)  stiffness/tightness   Manual Therapy   Manual Therapy Myofascial release;Passive ROM  Bilateral SCM, Scalenes, Levator Scap   Passive ROM all motions for cervical spine                PT Education - 07/29/14 1432    Education provided Yes   Person(s) Educated Patient   Methods Explanation;Demonstration;Verbal cues   Comprehension Verbalized understanding;Returned demonstration           PT Short Term Goals - 07/05/14 1142    PT SHORT TERM GOAL #1   Title be independent with initial HEP   Time 4   Period Weeks   Status Achieved           PT Long Term Goals - 07/09/14 1541    PT LONG TERM GOAL #1   Title demonstrate and/or verbalize techniques to reduce the risk of re-injury to include info on: posture, body mechanics, lifting   Time 8   Period Weeks   Status Achieved   PT LONG TERM GOAL #2   Title be independent with advanced HEP   Time 8   Period Weeks   Status On-going   PT LONG TERM GOAL #3   Title report pain decrease 50%   Baseline 5/10   Period Weeks   Status Achieved   PT LONG TERM GOAL #4   Title increase ROM 25%   Time 8   Period Weeks   Status Achieved   PT LONG TERM GOAL #5   Title lift child without extra pain   Period Weeks   Status Achieved   PT LONG TERM GOAL #6   Title tolerate standing for 20  minutes   Time 8   Period Weeks   Status Achieved               Plan - 07/29/14 1436    Clinical Impression Statement patient did well with ROM exercises for CS and manual therapy today   PT Frequency 2x / week   PT Next Visit Plan assess for renewal of 2 weeks for focus on neck ROM        Problem List There are no active problems to display for this patient.    Lady Deutscher, PTA  07/29/2014, 2:43 PM  [facility] [place] [address] [place], [place], [address] Phone: [phone]   Fax:  [phone]

## 2014-07-29 NOTE — Patient Instructions (Signed)
Levator stretch with back to wall, feet away from wall knees bent, turn head to right with gaze to R pocket 3X 20 seconds.

## 2014-08-02 ENCOUNTER — Encounter: Payer: Self-pay | Admitting: Physical Therapy

## 2014-08-02 ENCOUNTER — Ambulatory Visit: Payer: Medicaid Other | Admitting: Physical Therapy

## 2014-08-02 DIAGNOSIS — M542 Cervicalgia: Secondary | ICD-10-CM | POA: Diagnosis not present

## 2014-08-02 DIAGNOSIS — M545 Low back pain: Secondary | ICD-10-CM

## 2014-08-02 NOTE — Therapy (Signed)
[place], [place], [address] Phone: [phone]   Fax:  [phone]  Physical Therapy Treatment  Patient Details  Name: Brooke Cruz MRN: [id number] Date of Birth: 04/07/1970 Referring Provider:  Marlaine Hind, MD  Encounter Date: 08/02/2014      PT End of Session - 08/02/14 1039    Visit Number 23   PT Start Time 7824   PT Stop Time 2353   PT Time Calculation (min) 27 min      Past Medical History  Diagnosis Date  . Pregnant   . Depression     Past Surgical History  Procedure Laterality Date  . Cesarean section    . Nasal sinus surgery      There were no vitals filed for this visit.  Visit Diagnosis:  Pain in neck  Bilateral low back pain, with sciatica presence unspecified          [facility] PT Assessment - 08/02/14 0001    AROM   AROM Assessment Site Lumbar   Cervical Flexion decreased 25%   Cervical Extension WFLS   Cervical - Right Side Bend decreased 25%   Cervical - Left Side Bend decreased 25%   Cervical - Right Rotation WFLS   Cervical - Left Rotation decreased 25%   Lumbar Flexion WFLS   Lumbar Extension WFLS   Lumbar - Right Side Bend WFL   Lumbar - Left Side Bend decreased 25%                             PT Education - 08/02/14 1039    Education provided Yes   Education Details scap stab with green tband,hip ext and abd with green tband. BM, lifting and ADLS for correct tech and to avoid reinjury   Person(s) Educated Patient   Methods Explanation;Demonstration;Handout   Comprehension Verbalized understanding;Returned demonstration          PT Short Term Goals - 07/05/14 1142    PT SHORT TERM GOAL #1   Title be independent with initial HEP   Time 4   Period Weeks   Status Achieved           PT Long Term Goals - 08/02/14 1041    PT LONG TERM GOAL #1   Title demonstrate and/or verbalize techniques to reduce the  risk of re-injury to include info on: posture, body mechanics, lifting   Status Achieved   PT LONG TERM GOAL #2   Title be independent with advanced HEP   Status Achieved   PT LONG TERM GOAL #3   Title report pain decrease 50%   Baseline neck 50% back 75%   Status Achieved   PT LONG TERM GOAL #4   Title increase ROM 25%   Status Achieved   PT LONG TERM GOAL #5   Title lift child without extra pain   Status Achieved   PT LONG TERM GOAL #6   Title tolerate standing for 20 minutes   Status Achieved               Plan - 08/02/14 1040    Clinical Impression Statement all goals met, independant with advanced HEP and good BM understanding to avoid reinjury   PT Next Visit Plan HOLD 2 weeks to assure no reinjury or flare up ( pt alittle concerned injection is temp fix) if no pt follow up by May 16th D/C  Problem List There are no active problems to display for this patient.   Brooke Cruz,ANGIE PTA 08/02/2014, 10:43 AM  [place], [place], [address] Phone: [phone]   Fax:  [phone]

## 2014-08-06 ENCOUNTER — Ambulatory Visit: Payer: No Typology Code available for payment source | Admitting: Physical Therapy

## 2014-08-08 ENCOUNTER — Ambulatory Visit: Payer: No Typology Code available for payment source | Admitting: Physical Therapy

## 2014-08-27 ENCOUNTER — Emergency Department (HOSPITAL_BASED_OUTPATIENT_CLINIC_OR_DEPARTMENT_OTHER)
Admission: EM | Admit: 2014-08-27 | Discharge: 2014-08-27 | Disposition: A | Discharge status: Home / Self Care | Payer: Medicaid Other | Attending: Emergency Medicine | Admitting: Emergency Medicine

## 2014-08-27 ENCOUNTER — Encounter (HOSPITAL_BASED_OUTPATIENT_CLINIC_OR_DEPARTMENT_OTHER): Payer: Self-pay | Admitting: *Deleted

## 2014-08-27 ENCOUNTER — Emergency Department (HOSPITAL_BASED_OUTPATIENT_CLINIC_OR_DEPARTMENT_OTHER): Payer: Medicaid Other

## 2014-08-27 DIAGNOSIS — R1012 Left upper quadrant pain: Secondary | ICD-10-CM | POA: Insufficient documentation

## 2014-08-27 DIAGNOSIS — R1013 Epigastric pain: Secondary | ICD-10-CM | POA: Diagnosis not present

## 2014-08-27 DIAGNOSIS — Z91040 Latex allergy status: Secondary | ICD-10-CM | POA: Diagnosis not present

## 2014-08-27 DIAGNOSIS — H9319 Tinnitus, unspecified ear: Secondary | ICD-10-CM | POA: Diagnosis not present

## 2014-08-27 DIAGNOSIS — Z3202 Encounter for pregnancy test, result negative: Secondary | ICD-10-CM | POA: Insufficient documentation

## 2014-08-27 DIAGNOSIS — R1030 Lower abdominal pain, unspecified: Secondary | ICD-10-CM | POA: Diagnosis not present

## 2014-08-27 DIAGNOSIS — Z792 Long term (current) use of antibiotics: Secondary | ICD-10-CM | POA: Insufficient documentation

## 2014-08-27 DIAGNOSIS — R112 Nausea with vomiting, unspecified: Secondary | ICD-10-CM | POA: Diagnosis present

## 2014-08-27 DIAGNOSIS — R079 Chest pain, unspecified: Secondary | ICD-10-CM | POA: Insufficient documentation

## 2014-08-27 DIAGNOSIS — B349 Viral infection, unspecified: Secondary | ICD-10-CM | POA: Insufficient documentation

## 2014-08-27 DIAGNOSIS — Z79899 Other long term (current) drug therapy: Secondary | ICD-10-CM | POA: Insufficient documentation

## 2014-08-27 DIAGNOSIS — R42 Dizziness and giddiness: Secondary | ICD-10-CM | POA: Insufficient documentation

## 2014-08-27 DIAGNOSIS — R197 Diarrhea, unspecified: Secondary | ICD-10-CM

## 2014-08-27 DIAGNOSIS — Z8659 Personal history of other mental and behavioral disorders: Secondary | ICD-10-CM | POA: Insufficient documentation

## 2014-08-27 DIAGNOSIS — R1110 Vomiting, unspecified: Secondary | ICD-10-CM

## 2014-08-27 DIAGNOSIS — R202 Paresthesia of skin: Secondary | ICD-10-CM | POA: Diagnosis not present

## 2014-08-27 LAB — CBC
HCT: 43.9 % (ref 36.0–46.0)
Hemoglobin: 14.6 g/dL (ref 12.0–15.0)
MCH: 29.6 pg (ref 26.0–34.0)
MCHC: 33.3 g/dL (ref 30.0–36.0)
MCV: 88.9 fL (ref 78.0–100.0)
Platelets: 250 10*3/uL (ref 150–400)
RBC: 4.94 MIL/uL (ref 3.87–5.11)
RDW: 13.8 % (ref 11.5–15.5)
WBC: 8.5 10*3/uL (ref 4.0–10.5)

## 2014-08-27 LAB — COMPREHENSIVE METABOLIC PANEL
ALBUMIN: 4.3 g/dL (ref 3.5–5.0)
ALK PHOS: 64 U/L (ref 38–126)
ALT: 29 U/L (ref 14–54)
AST: 36 U/L (ref 15–41)
Anion gap: 14 (ref 5–15)
BUN: 15 mg/dL (ref 6–20)
CO2: 22 mmol/L (ref 22–32)
Calcium: 9.3 mg/dL (ref 8.9–10.3)
Chloride: 103 mmol/L (ref 101–111)
Creatinine, Ser: 0.66 mg/dL (ref 0.44–1.00)
GFR calc Af Amer: >60 mL/min (ref >60)
GFR calc non Af Amer: >60 mL/min (ref >60)
GLUCOSE: 107 mg/dL — AB (ref 65–99)
POTASSIUM: 4.3 mmol/L (ref 3.5–5.1)
SODIUM: 139 mmol/L (ref 135–145)
TOTAL PROTEIN: 7.6 g/dL (ref 6.5–8.1)
Total Bilirubin: 0.8 mg/dL (ref 0.3–1.2)

## 2014-08-27 LAB — URINALYSIS, ROUTINE W REFLEX MICROSCOPIC
Bilirubin Urine: NEGATIVE
GLUCOSE, UA: NEGATIVE mg/dL
KETONES UR: 15 mg/dL — AB
LEUKOCYTES UA: NEGATIVE
Nitrite: NEGATIVE
PROTEIN: NEGATIVE mg/dL
Specific Gravity, Urine: 1.023 (ref 1.005–1.030)
Urobilinogen, UA: 0.2 mg/dL (ref 0.0–1.0)
pH: 7 (ref 5.0–8.0)

## 2014-08-27 LAB — URINE MICROSCOPIC-ADD ON

## 2014-08-27 LAB — PREGNANCY, URINE: PREG TEST UR: NEGATIVE

## 2014-08-27 LAB — TROPONIN I: Troponin I: <0.03 ng/mL (ref <0.031)

## 2014-08-27 LAB — LIPASE, BLOOD: LIPASE: 28 U/L (ref 22–51)

## 2014-08-27 MED ORDER — SODIUM CHLORIDE 0.9 % IV BOLUS (SEPSIS)
1000.0000 mL | Freq: Once | INTRAVENOUS | Status: AC
  Administered 2014-08-27: 1000 mL via INTRAVENOUS

## 2014-08-27 MED ORDER — ONDANSETRON HCL 4 MG/2ML IJ SOLN
4.0000 mg | Freq: Once | INTRAMUSCULAR | Status: AC
Start: 2014-08-27 — End: 2014-08-27
  Administered 2014-08-27: 4 mg via INTRAVENOUS
  Filled 2014-08-27: qty 2

## 2014-08-27 MED ORDER — KETOROLAC TROMETHAMINE 30 MG/ML IJ SOLN
30.0000 mg | Freq: Once | INTRAMUSCULAR | Status: AC
  Administered 2014-08-27: 30 mg via INTRAVENOUS
  Filled 2014-08-27: qty 1

## 2014-08-27 MED ORDER — GI COCKTAIL ~~LOC~~
30.0000 mL | Freq: Once | ORAL | Status: AC
  Administered 2014-08-27: 30 mL via ORAL
  Filled 2014-08-27: qty 30

## 2014-08-27 MED ORDER — ONDANSETRON HCL 4 MG PO TABS: 4.0000 mg | ORAL_TABLET | Freq: Four times a day (QID) | ORAL | Status: DC

## 2014-08-27 NOTE — Discharge Instructions (Signed)
Take zofran as directed as needed for nausea. Rest and stay well hydrated.  Chest Pain (Nonspecific) It is often hard to give a specific diagnosis for the cause of chest pain. There is always a chance that your pain could be related to something serious, such as a heart attack or a blood clot in the lungs. You need to follow up with your health care provider for further evaluation. CAUSES   Heartburn.  Pneumonia or bronchitis.  Anxiety or stress.  Inflammation around your heart (pericarditis) or lung (pleuritis or pleurisy).  A blood clot in the lung.  A collapsed lung (pneumothorax). It can develop suddenly on its own (spontaneous pneumothorax) or from trauma to the chest.  Shingles infection (herpes zoster virus). The chest wall is composed of bones, muscles, and cartilage. Any of these can be the source of the pain.  The bones can be bruised by injury.  The muscles or cartilage can be strained by coughing or overwork.  The cartilage can be affected by inflammation and become sore (costochondritis). DIAGNOSIS  Lab tests or other studies may be needed to find the cause of your pain. Your health care provider may have you take a test called an ambulatory electrocardiogram (ECG). An ECG records your heartbeat patterns over a 24-hour period. You may also have other tests, such as:  Transthoracic echocardiogram (TTE). During echocardiography, sound waves are used to evaluate how blood flows through your heart.  Transesophageal echocardiogram (TEE).  Cardiac monitoring. This allows your health care provider to monitor your heart rate and rhythm in real time.  Holter monitor. This is a portable device that records your heartbeat and can help diagnose heart arrhythmias. It allows your health care provider to track your heart activity for several days, if needed.  Stress tests by exercise or by giving medicine that makes the heart beat faster. TREATMENT   Treatment depends on what  may be causing your chest pain. Treatment may include:  Acid blockers for heartburn.  Anti-inflammatory medicine.  Pain medicine for inflammatory conditions.  Antibiotics if an infection is present.  You may be advised to change lifestyle habits. This includes stopping smoking and avoiding alcohol, caffeine, and chocolate.  You may be advised to keep your head raised (elevated) when sleeping. This reduces the chance of acid going backward from your stomach into your esophagus. Most of the time, nonspecific chest pain will improve within 2-3 days with rest and mild pain medicine.  HOME CARE INSTRUCTIONS   If antibiotics were prescribed, take them as directed. Finish them even if you start to feel better.  For the next few days, avoid physical activities that bring on chest pain. Continue physical activities as directed.  Do not use any tobacco products, including cigarettes, chewing tobacco, or electronic cigarettes.  Avoid drinking alcohol.  Only take medicine as directed by your health care provider.  Follow your health care provider's suggestions for further testing if your chest pain does not go away.  Keep any follow-up appointments you made. If you do not go to an appointment, you could develop lasting (chronic) problems with pain. If there is any problem keeping an appointment, call to reschedule. SEEK MEDICAL CARE IF:   Your chest pain does not go away, even after treatment.  You have a rash with blisters on your chest.  You have a fever. SEEK IMMEDIATE MEDICAL CARE IF:   You have increased chest pain or pain that spreads to your arm, neck, jaw, back, or abdomen.  You have shortness of breath.  You have an increasing cough, or you cough up blood.  You have severe back or abdominal pain.  You feel nauseous or vomit.  You have severe weakness.  You faint.  You have chills. This is an emergency. Do not wait to see if the pain will go away. Get medical help at  once. Call your local emergency services (911 in [place] Do not drive yourself to the hospital. MAKE SURE YOU:   Understand these instructions.  Will watch your condition.  Will get help right away if you are not doing well or get worse. Document Released: 12/30/2004 Document Revised: 03/27/2013 Document Reviewed: 10/26/2007 [facility] Patient Information 2015 [place], [place]. This information is not intended to replace advice given to you by your health care provider. Make sure you discuss any questions you have with your health care provider.  Diarrhea Diarrhea is frequent loose and watery bowel movements. It can cause you to feel weak and dehydrated. Dehydration can cause you to become tired and thirsty, have a dry mouth, and have decreased urination that often is dark yellow. Diarrhea is a sign of another problem, most often an infection that will not last long. In most cases, diarrhea typically lasts 2-3 days. However, it can last longer if it is a sign of something more serious. It is important to treat your diarrhea as directed by your caregiver to lessen or prevent future episodes of diarrhea. CAUSES  Some common causes include:  Gastrointestinal infections caused by viruses, bacteria, or parasites.  Food poisoning or food allergies.  Certain medicines, such as antibiotics, chemotherapy, and laxatives.  Artificial sweeteners and fructose.  Digestive disorders. HOME CARE INSTRUCTIONS  Ensure adequate fluid intake (hydration): Have 1 cup (8 oz) of fluid for each diarrhea episode. Avoid fluids that contain simple sugars or sports drinks, fruit juices, whole milk products, and sodas. Your urine should be clear or pale yellow if you are drinking enough fluids. Hydrate with an oral rehydration solution that you can purchase at pharmacies, retail stores, and online. You can prepare an oral rehydration solution at home by mixing the following ingredients together:   - tsp table salt.    tsp baking soda.   tsp salt substitute containing potassium chloride.  1  tablespoons sugar.  1 L (34 oz) of water.  Certain foods and beverages may increase the speed at which food moves through the gastrointestinal (GI) tract. These foods and beverages should be avoided and include:  Caffeinated and alcoholic beverages.  High-fiber foods, such as raw fruits and vegetables, nuts, seeds, and whole grain breads and cereals.  Foods and beverages sweetened with sugar alcohols, such as xylitol, sorbitol, and mannitol.  Some foods may be well tolerated and may help thicken stool including:  Starchy foods, such as rice, toast, pasta, low-sugar cereal, oatmeal, grits, baked potatoes, crackers, and bagels.  Bananas.  Applesauce.  Add probiotic-rich foods to help increase healthy bacteria in the GI tract, such as yogurt and fermented milk products.  Wash your hands well after each diarrhea episode.  Only take over-the-counter or prescription medicines as directed by your caregiver.  Take a warm bath to relieve any burning or pain from frequent diarrhea episodes. SEEK IMMEDIATE MEDICAL CARE IF:   You are unable to keep fluids down.  You have persistent vomiting.  You have blood in your stool, or your stools are black and tarry.  You do not urinate in 6-8 hours, or there is only a small  amount of very dark urine.  You have abdominal pain that increases or localizes.  You have weakness, dizziness, confusion, or light-headedness.  You have a severe headache.  Your diarrhea gets worse or does not get better.  You have a fever or persistent symptoms for more than 2-3 days.  You have a fever and your symptoms suddenly get worse. MAKE SURE YOU:   Understand these instructions.  Will watch your condition.  Will get help right away if you are not doing well or get worse. Document Released: 03/12/2002 Document Revised: 08/06/2013 Document Reviewed: 11/28/2011 [facility] Patient  Information 2015 [employer], [place]. This information is not intended to replace advice given to you by your health care provider. Make sure you discuss any questions you have with your health care provider. Food Choices to Help Relieve Diarrhea When you have diarrhea, the foods you eat and your eating habits are very important. Choosing the right foods and drinks can help relieve diarrhea. Also, because diarrhea can last up to 7 days, you need to replace lost fluids and electrolytes (such as sodium, potassium, and chloride) in order to help prevent dehydration.  WHAT GENERAL GUIDELINES DO I NEED TO FOLLOW?  Slowly drink 1 cup (8 oz) of fluid for each episode of diarrhea. If you are getting enough fluid, your urine will be clear or pale yellow.  Eat starchy foods. Some good choices include white rice, white toast, pasta, low-fiber cereal, baked potatoes (without the skin), saltine crackers, and bagels.  Avoid large servings of any cooked vegetables.  Limit fruit to two servings per day. A serving is  cup or 1 small piece.  Choose foods with less than 2 g of fiber per serving.  Limit fats to less than 8 tsp (38 g) per day.  Avoid fried foods.  Eat foods that have probiotics in them. Probiotics can be found in certain dairy products.  Avoid foods and beverages that may increase the speed at which food moves through the stomach and intestines (gastrointestinal tract). Things to avoid include:  High-fiber foods, such as dried fruit, raw fruits and vegetables, nuts, seeds, and whole grain foods.  Spicy foods and high-fat foods.  Foods and beverages sweetened with high-fructose corn syrup, honey, or sugar alcohols such as xylitol, sorbitol, and mannitol. WHAT FOODS ARE RECOMMENDED? Grains White rice. White, [place], or pita breads (fresh or toasted), including plain rolls, buns, or bagels. White pasta. Saltine, soda, or graham crackers. Pretzels. Low-fiber cereal. Cooked cereals made with water  (such as cornmeal, farina, or cream cereals). Plain muffins. Matzo. Melba toast. Zwieback.  Vegetables Potatoes (without the skin). Strained tomato and vegetable juices. Most well-cooked and canned vegetables without seeds. Tender lettuce. Fruits Cooked or canned applesauce, apricots, cherries, fruit cocktail, grapefruit, peaches, pears, or plums. Fresh bananas, apples without skin, cherries, grapes, cantaloupe, grapefruit, peaches, oranges, or plums.  Meat and Other Protein Products Baked or boiled chicken. Eggs. Tofu. Fish. Seafood. Smooth peanut butter. Ground or well-cooked tender beef, ham, veal, lamb, pork, or poultry.  Dairy Plain yogurt, kefir, and unsweetened liquid yogurt. Lactose-free milk, buttermilk, or soy milk. Plain hard cheese. Beverages Sport drinks. Clear broths. Diluted fruit juices (except prune). Regular, caffeine-free sodas such as ginger ale. Water. Decaffeinated teas. Oral rehydration solutions. Sugar-free beverages not sweetened with sugar alcohols. Other Bouillon, broth, or soups made from recommended foods.  The items listed above may not be a complete list of recommended foods or beverages. Contact your dietitian for more options. WHAT FOODS ARE NOT  RECOMMENDED? Grains Whole grain, whole wheat, bran, or rye breads, rolls, pastas, crackers, and cereals. Wild or brown rice. Cereals that contain more than 2 g of fiber per serving. Corn tortillas or taco shells. Cooked or dry oatmeal. Granola. Popcorn. Vegetables Raw vegetables. Cabbage, broccoli, Brussels sprouts, artichokes, baked beans, beet greens, corn, kale, legumes, peas, sweet potatoes, and yams. Potato skins. Cooked spinach and cabbage. Fruits Dried fruit, including raisins and dates. Raw fruits. Stewed or dried prunes. Fresh apples with skin, apricots, mangoes, pears, raspberries, and strawberries.  Meat and Other Protein Products Chunky peanut butter. Nuts and seeds. Beans and lentils. Tomasa BlaseBacon.   Dairy High-fat cheeses. Milk, chocolate milk, and beverages made with milk, such as milk shakes. Cream. Ice cream. Sweets and Desserts Sweet rolls, doughnuts, and sweet breads. Pancakes and waffles. Fats and Oils Butter. Cream sauces. Margarine. Salad oils. Plain salad dressings. Olives. Avocados.  Beverages Caffeinated beverages (such as coffee, tea, soda, or energy drinks). Alcoholic beverages. Fruit juices with pulp. Prune juice. Soft drinks sweetened with high-fructose corn syrup or sugar alcohols. Other Coconut. Hot sauce. Chili powder. Mayonnaise. Gravy. Cream-based or milk-based soups.  The items listed above may not be a complete list of foods and beverages to avoid. Contact your dietitian for more information. WHAT SHOULD I DO IF I BECOME DEHYDRATED? Diarrhea can sometimes lead to dehydration. Signs of dehydration include dark urine and dry mouth and skin. If you think you are dehydrated, you should rehydrate with an oral rehydration solution. These solutions can be purchased at pharmacies, retail stores, or online.  Drink -1 cup (120-240 mL) of oral rehydration solution each time you have an episode of diarrhea. If drinking this amount makes your diarrhea worse, try drinking smaller amounts more often. For example, drink 1-3 tsp (5-15 mL) every 5-10 minutes.  A general rule for staying hydrated is to drink 1-2 L of fluid per day. Talk to your health care provider about the specific amount you should be drinking each day. Drink enough fluids to keep your urine clear or pale yellow. Document Released: 06/12/2003 Document Revised: 03/27/2013 Document Reviewed: 02/12/2013 [facility] Patient Information 2015 [employer], [place]. This information is not intended to replace advice given to you by your health care provider. Make sure you discuss any questions you have with your health care provider.  Nausea and Vomiting Nausea is a sick feeling that often comes before throwing up (vomiting).  Vomiting is a reflex where stomach contents come out of your mouth. Vomiting can cause severe loss of body fluids (dehydration). Children and elderly adults can become dehydrated quickly, especially if they also have diarrhea. Nausea and vomiting are symptoms of a condition or disease. It is important to find the cause of your symptoms. CAUSES   Direct irritation of the stomach lining. This irritation can result from increased acid production (gastroesophageal reflux disease), infection, food poisoning, taking certain medicines (such as nonsteroidal anti-inflammatory drugs), alcohol use, or tobacco use.  Signals from the brain.These signals could be caused by a headache, heat exposure, an inner ear disturbance, increased pressure in the brain from injury, infection, a tumor, or a concussion, pain, emotional stimulus, or metabolic problems.  An obstruction in the gastrointestinal tract (bowel obstruction).  Illnesses such as diabetes, hepatitis, gallbladder problems, appendicitis, kidney problems, cancer, sepsis, atypical symptoms of a heart attack, or eating disorders.  Medical treatments such as chemotherapy and radiation.  Receiving medicine that makes you sleep (general anesthetic) during surgery. DIAGNOSIS Your caregiver may ask for tests to be  done if the problems do not improve after a few days. Tests may also be done if symptoms are severe or if the reason for the nausea and vomiting is not clear. Tests may include:  Urine tests.  Blood tests.  Stool tests.  Cultures (to look for evidence of infection).  X-rays or other imaging studies. Test results can help your caregiver make decisions about treatment or the need for additional tests. TREATMENT You need to stay well hydrated. Drink frequently but in small amounts.You may wish to drink water, sports drinks, clear broth, or eat frozen ice pops or gelatin dessert to help stay hydrated.When you eat, eating slowly may help prevent  nausea.There are also some antinausea medicines that may help prevent nausea. HOME CARE INSTRUCTIONS   Take all medicine as directed by your caregiver.  If you do not have an appetite, do not force yourself to eat. However, you must continue to drink fluids.  If you have an appetite, eat a normal diet unless your caregiver tells you differently.  Eat a variety of complex carbohydrates (rice, wheat, potatoes, bread), lean meats, yogurt, fruits, and vegetables.  Avoid high-fat foods because they are more difficult to digest.  Drink enough water and fluids to keep your urine clear or pale yellow.  If you are dehydrated, ask your caregiver for specific rehydration instructions. Signs of dehydration may include:  Severe thirst.  Dry lips and mouth.  Dizziness.  Dark urine.  Decreasing urine frequency and amount.  Confusion.  Rapid breathing or pulse. SEEK IMMEDIATE MEDICAL CARE IF:   You have blood or brown flecks (like coffee grounds) in your vomit.  You have black or bloody stools.  You have a severe headache or stiff neck.  You are confused.  You have severe abdominal pain.  You have chest pain or trouble breathing.  You do not urinate at least once every 8 hours.  You develop cold or clammy skin.  You continue to vomit for longer than 24 to 48 hours.  You have a fever. MAKE SURE YOU:   Understand these instructions.  Will watch your condition.  Will get help right away if you are not doing well or get worse. Document Released: 03/22/2005 Document Revised: 06/14/2011 Document Reviewed: 08/19/2010 [facility] Patient Information 2015 [place], [place]. This information is not intended to replace advice given to you by your health care provider. Make sure you discuss any questions you have with your health care provider.

## 2014-08-27 NOTE — ED Provider Notes (Signed)
CSN: [id number]     Arrival date & time 08/27/14  1328 History   First MD Initiated Contact with Patient 08/27/14 1330     Chief Complaint  Patient presents with  . Emesis  . Chest Pain     (Consider location/radiation/quality/duration/timing/severity/associated sxs/prior Treatment) HPI Comments: 44 y/o F c/o sudden onset vomiting beginning around 5AM today. Pt reports over 10 episodes of emesis, initially brown digested food, eventually becoming yellow. Admits to associated abdominal pain located in epigastric, left upper quadrant and suprapubic area described as an aching feeling. 3 hours ago, she developed chest pain below her left breast, radiating throughout the left side of her chest into the left side of her neck. Pain is a constant throb, occasionally becoming worse, 9/10. Nothing in specific makes the pain better or worse. States her arms and legs or tingling, and when she stood up earlier she felt dizzy as if she was spinning along with ringing in her ears. Denies fever. Reports a few episodes of diarrhea today. Has not urinated much today. LMP 8 day ago. States "everyone in the house except the baby are sick including my wife and two other kids". Admits to a family history of early heart disease in a grandparent. She is scheduled for a stress test at the beginning of next month due to having chest pain in her PCPs office about 3 weeks ago. The pain is different than today.  Patient is a 44 y.o. female presenting with vomiting and chest pain. The history is provided by the patient.  Emesis Associated symptoms: abdominal pain and diarrhea   Chest Pain Associated symptoms: abdominal pain, dizziness, nausea and vomiting     Past Medical History  Diagnosis Date  . Depression    Past Surgical History  Procedure Laterality Date  . Cesarean section    . Nasal sinus surgery     No family history on file. History  Substance Use Topics  . Smoking status: Never Smoker   . Smokeless  tobacco: Not on file  . Alcohol Use: No   OB History    Gravida Para Term Preterm AB TAB SAB Ectopic Multiple Living   6 3   2  2         Review of Systems  HENT: Positive for tinnitus.   Cardiovascular: Positive for chest pain.  Gastrointestinal: Positive for nausea, vomiting, abdominal pain and diarrhea.  Neurological: Positive for dizziness.       +Tingling.  All other systems reviewed and are negative.     Allergies  Eggs or egg-derived products; Fluoride preparations; Latex; Onion; Other; and Sulfa antibiotics  Home Medications   Prior to Admission medications   Medication Sig Start Date End Date Taking? Authorizing Provider  amoxicillin (AMOXIL) 500 MG capsule Take 1 capsule (500 mg total) by mouth 2 (two) times daily. 11/03/13   Charm RingsErin J Honig, MD  Cetirizine HCl (ZYRTEC PO) Take 10 mg by mouth daily.     Historical Provider, MD  cholecalciferol (VITAMIN D) 1000 UNITS tablet Take 1,000 Units by mouth daily.    Historical Provider, MD  co-enzyme Q-10 50 MG capsule Take 50 mg by mouth daily.    Historical Provider, MD  EPINEPHrine (EPIPEN) 0.3 mg/0.3 mL DEVI Inject 0.3 mLs (0.3 mg total) into the muscle as needed. 04/04/12   Zadie Rhineonald Wickline, MD  ondansetron (ZOFRAN) 4 MG tablet Take 1 tablet (4 mg total) by mouth every 6 (six) hours. 08/27/14   Kathrynn Speedobyn M Enolia Koepke, PA-C  PRENATAL  VITAMINS PO Take 1 tablet by mouth daily.     Historical Provider, MD  vitamin B-12 (CYANOCOBALAMIN) 100 MCG tablet Take 100 mcg by mouth daily.    Historical Provider, MD   BP 131/80 mmHg  Pulse 85  Temp(Src) 98 F (36.7 C) (Oral)  Resp 22  Ht  (1.575 m)  Wt 169 lb (76.658 kg)  BMI 30.90 kg/m2  SpO2 99%  LMP 08/21/2014 Physical Exam  Constitutional: She is oriented to person, place, and time. She appears well-developed and well-nourished. No distress.  HENT:  Head: Normocephalic and atraumatic.  Mouth/Throat: Oropharynx is clear and moist.  Eyes: Conjunctivae and EOM are normal. Pupils are  equal, round, and reactive to light.  Neck: Normal range of motion. Neck supple. No JVD present.  Cardiovascular: Normal rate, regular rhythm, normal heart sounds and intact distal pulses.   No extremity edema.  Pulmonary/Chest: Effort normal and breath sounds normal. No respiratory distress. She exhibits no tenderness.  Abdominal: Soft. Normal appearance and bowel sounds are normal. She exhibits no distension. There is tenderness in the epigastric area, suprapubic area and left upper quadrant. There is no rigidity, no rebound, no guarding, no tenderness at McBurney's point and negative Murphy's sign.  Musculoskeletal: Normal range of motion. She exhibits no edema.  Neurological: She is alert and oriented to person, place, and time. She has normal strength. No cranial nerve deficit or sensory deficit. Coordination normal. GCS eye subscore is 4. GCS verbal subscore is 5. GCS motor subscore is 6.  Speech fluent, goal oriented. Moves limbs without ataxia. No pronator drift.  Skin: Skin is warm and dry. She is not diaphoretic.  Psychiatric: She has a normal mood and affect. Her behavior is normal.  Nursing note and vitals reviewed.   ED Course  Procedures (including critical care time) Labs Review Labs Reviewed  COMPREHENSIVE METABOLIC PANEL - Abnormal; Notable for the following:    Glucose, Bld 107 (*)    All other components within normal limits  URINALYSIS, ROUTINE W REFLEX MICROSCOPIC - Abnormal; Notable for the following:    Hgb urine dipstick SMALL (*)    Ketones, ur 15 (*)    All other components within normal limits  CBC  LIPASE, BLOOD  PREGNANCY, URINE  TROPONIN I  URINE MICROSCOPIC-ADD ON    Imaging Review Dg Chest 2 View  08/27/2014   CLINICAL DATA:  Left-sided chest pain for hours with nausea and vomiting.  EXAM: CHEST  2 VIEW  COMPARISON:  None.  FINDINGS: Lungs are well inflated without focal consolidation or effusion. Cardiomediastinal silhouette is within normal. Bones  soft tissues are normal.  IMPRESSION: No active cardiopulmonary disease.   Electronically Signed   By: Elberta Fortis M.D.   On: 08/27/2014 15:16     EKG Interpretation   Date/Time:  Tuesday Aug 27 2014 13:35:37 EDT Ventricular Rate:  92 PR Interval:  130 QRS Duration: 78 QT Interval:  362 QTC Calculation: 447 R Axis:   64 Text Interpretation:  Normal sinus rhythm Normal ECG Confirmed by DELO   MD, DOUGLAS ([id number]) on 08/27/2014 1:56:30 PM      MDM   Final diagnoses:  Viral illness  Vomiting and diarrhea  Left sided chest pain   Non-toxic appearing, NAD. AFVSS. Abdomen soft without peritoneal signs. No vomiting in ED. Pt given IV fluids and zofran improving her symptoms. Workup negative. Regarding CP, EKG normal. Completely resolved with GI cocktail. Doubt cardiac. HEART score 1. PERC negative. Abdominal cramping still present,  however partially resolved with toradol. Family members sick with similar symptoms. Most likely viral illness. Tolerating PO in ED. Stable for d/c. F/u with PCP. Return precautions given. Patient states understanding of treatment care plan and is agreeable.  Kathrynn Speed, PA-C 08/27/14 1545  Geoffery Lyons, MD 08/27/14 [phone]

## 2014-08-27 NOTE — ED Notes (Signed)
Pt asked for urine sample. Pt unable to void at this time.

## 2014-08-27 NOTE — ED Notes (Signed)
Reports that everyone in the home is vomiting.

## 2014-08-27 NOTE — ED Notes (Signed)
Vomiting since 5am. States 3 hours ago she started having pain in her chest. Arms and legs and numb and she is dizzy.

## 2014-08-27 NOTE — ED Notes (Signed)
PA at bedside.

## 2014-10-23 ENCOUNTER — Encounter (HOSPITAL_COMMUNITY): Payer: Self-pay | Admitting: *Deleted

## 2014-10-23 ENCOUNTER — Emergency Department (HOSPITAL_COMMUNITY)
Admission: EM | Admit: 2014-10-23 | Discharge: 2014-10-23 | Disposition: A | Discharge status: Home / Self Care | Payer: Medicaid Other | Source: Home / Self Care

## 2014-10-23 DIAGNOSIS — R5381 Other malaise: Secondary | ICD-10-CM

## 2014-10-23 DIAGNOSIS — M545 Low back pain, unspecified: Secondary | ICD-10-CM

## 2014-10-23 DIAGNOSIS — R319 Hematuria, unspecified: Secondary | ICD-10-CM | POA: Diagnosis not present

## 2014-10-23 LAB — POCT URINALYSIS DIP (DEVICE)
Bilirubin Urine: NEGATIVE
Glucose, UA: NEGATIVE mg/dL
Ketones, ur: NEGATIVE mg/dL
Leukocytes, UA: NEGATIVE
Nitrite: NEGATIVE
Protein, ur: NEGATIVE mg/dL
Specific Gravity, Urine: >=1.03 (ref 1.005–1.030)
Urobilinogen, UA: 0.2 mg/dL (ref 0.0–1.0)
pH: 6 (ref 5.0–8.0)

## 2014-10-23 MED ORDER — CIPROFLOXACIN HCL 500 MG PO TABS: 500.0000 mg | ORAL_TABLET | Freq: Two times a day (BID) | ORAL | Status: DC

## 2014-10-23 NOTE — ED Notes (Signed)
Pt  Reports symptoms  Of  Headache  dizzyness     Burning on  Urination         Since  tis  Am            symptoms  Not  releived         By  otc  meds         Ambulatory  To  The  Room  With a  Steady  Fluid gait

## 2014-10-23 NOTE — Discharge Instructions (Signed)
We will call you if the urine culture is positive;  otherwise stay on medicine until the culture results are finalized.

## 2014-10-23 NOTE — ED Provider Notes (Signed)
CSN: [id number]     Arrival date & time 10/23/14  1852 History   First MD Initiated Contact with Patient 10/23/14 1927     Chief Complaint  Patient presents with  . Headache   (Consider location/radiation/quality/duration/timing/severity/associated sxs/prior Treatment) The history is provided by the patient.   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 She has he in child with her who she is caring for now. She had a back injury which disabled her at least partially and she has chronic low back pain.  Yesterday patient took her son to [place] to get braces. She felt hot throughout the trip even know she was in air-conditioning. Today she feels "wiped out". She has some left flank pain which is a little bit different from her usual low back pain. She also feels flushed in her face. She has no sore throat or cough. She has no nausea or vomiting.  Past Medical History  Diagnosis Date  . Depression    Past Surgical History  Procedure Laterality Date  . Cesarean section    . Nasal sinus surgery     History reviewed. No pertinent family history. History  Substance Use Topics  . Smoking status: Never Smoker   . Smokeless tobacco: Not on file  . Alcohol Use: No   OB History    Gravida Para Term Preterm AB TAB SAB Ectopic Multiple Living   Review of Systems  Constitutional: Positive for fever and fatigue. Negative for chills, activity change and appetite change.  HENT: Positive for congestion and sinus pressure. Negative for dental problem, drooling, ear discharge, ear pain, facial swelling, hearing loss, mouth sores, sore throat, trouble swallowing and voice change.   Eyes: Negative.   Respiratory: Negative.   Cardiovascular: Negative.   Gastrointestinal: Negative.   Genitourinary: Positive for dysuria.  Musculoskeletal: Positive for myalgias.  Skin: Negative.   Neurological: Positive for dizziness.  Psychiatric/Behavioral: Negative.      Allergies  Eggs or egg-derived products; Fluoride preparations; Latex; Onion; Other; and Sulfa antibiotics  Home Medications   Prior to Admission medications   Medication Sig Start Date End Date Taking? Authorizing Provider  amoxicillin (AMOXIL) 500 MG capsule Take 1 capsule (500 mg total) by mouth 2 (two) times daily. 11/03/13   Charm Rings, MD  Cetirizine HCl (ZYRTEC PO) Take 10 mg by mouth daily.     Historical Provider, MD  cholecalciferol (VITAMIN D) 1000 UNITS tablet Take 1,000 Units by mouth daily.    Historical Provider, MD  co-enzyme Q-10 50 MG capsule Take 50 mg by mouth daily.    Historical Provider, MD  EPINEPHrine (EPIPEN) 0.3 mg/0.3 mL DEVI Inject 0.3 mLs (0.3 mg total) into the muscle as needed. 04/04/12   Zadie Rhine, MD  ondansetron (ZOFRAN) 4 MG tablet Take 1 tablet (4 mg total) by mouth every 6 (six) hours. 08/27/14   Robyn M Hess, PA-C  PRENATAL VITAMINS PO Take 1 tablet by mouth daily.     Historical Provider, MD  vitamin B-12 (CYANOCOBALAMIN) 100 MCG tablet Take 100 mcg by mouth daily.    Historical Provider, MD   BP 147/83 mmHg  Pulse 93  Temp(Src) 99.3 F (37.4 C) (Oral)  Resp 16  SpO2 97%  LMP 10/09/2014 Physical Exam  Constitutional: She is oriented to person, place, and time. She appears well-developed and well-nourished.  HENT:  Head: Normocephalic and atraumatic.  Right Ear: External ear  normal.  Left Ear: External ear normal.  Nose: Nose normal.  Mouth/Throat: Oropharynx is clear and moist. No oropharyngeal exudate.  Eyes: Conjunctivae and EOM are normal. Pupils are equal, round, and reactive to light. Right eye exhibits no discharge. Left eye exhibits no discharge.  Neck: Normal range of motion. Neck supple. No thyromegaly present.  Cardiovascular: Normal rate, regular rhythm, normal heart sounds and intact distal pulses.   Pulmonary/Chest: Effort normal and breath sounds normal.  Abdominal: Soft. There is no tenderness.  Musculoskeletal:  Normal range of motion.  Lymphadenopathy:    She has no cervical adenopathy.  Neurological: She is alert and oriented to person, place, and time. No cranial nerve deficit. Coordination normal.  Skin: Skin is warm and dry. No rash noted.  Psychiatric: She has a normal mood and affect. Her behavior is normal. Judgment normal.  Nursing note and vitals reviewed.   ED Course  Procedures (including critical care time) Labs Review Labs Reviewed  POCT URINALYSIS DIP (DEVICE) - Abnormal; Notable for the following:    Hgb urine dipstick MODERATE (*)    All other components within normal limits     MDM       ICD-9-CM ICD-10-CM   1. Hematuria 599.70 R31.9   2. Malaise 780.79 R53.81   3. Back pain at L4-L5 level 724.2 M54.5      Signed, Elvina SidleKurt Jecenia Leamer, MD    Elvina SidleKurt Jadin Kagel, MD 10/23/14 2011

## 2014-10-25 LAB — URINE CULTURE

## 2014-10-28 ENCOUNTER — Telehealth: Payer: Self-pay

## 2014-10-28 NOTE — Telephone Encounter (Signed)
Pt called back about labs. Notified. Not having any sxs

## 2014-11-01 ENCOUNTER — Encounter: Payer: Self-pay | Admitting: Family Medicine

## 2014-11-25 DIAGNOSIS — M791 Myalgia, unspecified site: Secondary | ICD-10-CM | POA: Insufficient documentation

## 2014-12-27 DIAGNOSIS — K5792 Diverticulitis of intestine, part unspecified, without perforation or abscess without bleeding: Secondary | ICD-10-CM | POA: Insufficient documentation

## 2015-04-22 ENCOUNTER — Emergency Department (HOSPITAL_BASED_OUTPATIENT_CLINIC_OR_DEPARTMENT_OTHER): Payer: Medicaid Other

## 2015-04-22 ENCOUNTER — Encounter (HOSPITAL_BASED_OUTPATIENT_CLINIC_OR_DEPARTMENT_OTHER): Payer: Self-pay | Admitting: *Deleted

## 2015-04-22 ENCOUNTER — Emergency Department (HOSPITAL_BASED_OUTPATIENT_CLINIC_OR_DEPARTMENT_OTHER)
Admission: EM | Admit: 2015-04-22 | Discharge: 2015-04-22 | Disposition: A | Discharge status: Home / Self Care | Payer: Medicaid Other | Attending: Emergency Medicine | Admitting: Emergency Medicine

## 2015-04-22 DIAGNOSIS — Z91040 Latex allergy status: Secondary | ICD-10-CM | POA: Insufficient documentation

## 2015-04-22 DIAGNOSIS — Z79899 Other long term (current) drug therapy: Secondary | ICD-10-CM | POA: Insufficient documentation

## 2015-04-22 DIAGNOSIS — Z792 Long term (current) use of antibiotics: Secondary | ICD-10-CM | POA: Insufficient documentation

## 2015-04-22 DIAGNOSIS — J111 Influenza due to unidentified influenza virus with other respiratory manifestations: Secondary | ICD-10-CM | POA: Diagnosis not present

## 2015-04-22 DIAGNOSIS — Z8659 Personal history of other mental and behavioral disorders: Secondary | ICD-10-CM | POA: Diagnosis not present

## 2015-04-22 DIAGNOSIS — R0981 Nasal congestion: Secondary | ICD-10-CM | POA: Diagnosis present

## 2015-04-22 DIAGNOSIS — R69 Illness, unspecified: Secondary | ICD-10-CM

## 2015-04-22 MED ORDER — IBUPROFEN 800 MG PO TABS
800.0000 mg | ORAL_TABLET | Freq: Once | ORAL | Status: AC
  Administered 2015-04-22: 800 mg via ORAL
  Filled 2015-04-22: qty 1

## 2015-04-22 MED ORDER — SODIUM CHLORIDE 0.9 % IV BOLUS (SEPSIS)
1000.0000 mL | Freq: Once | INTRAVENOUS | Status: AC
  Administered 2015-04-22: 1000 mL via INTRAVENOUS

## 2015-04-22 MED ORDER — DEXAMETHASONE SODIUM PHOSPHATE 10 MG/ML IJ SOLN
10.0000 mg | Freq: Once | INTRAMUSCULAR | Status: AC
  Administered 2015-04-22: 10 mg via INTRAVENOUS
  Filled 2015-04-22: qty 1

## 2015-04-22 MED ORDER — ALBUTEROL SULFATE (2.5 MG/3ML) 0.083% IN NEBU
5.0000 mg | INHALATION_SOLUTION | Freq: Once | RESPIRATORY_TRACT | Status: AC
  Administered 2015-04-22: 5 mg via RESPIRATORY_TRACT
  Filled 2015-04-22: qty 6

## 2015-04-22 MED ORDER — IBUPROFEN 800 MG PO TABS: 800.0000 mg | ORAL_TABLET | Freq: Three times a day (TID) | ORAL | Status: DC | PRN

## 2015-04-22 MED ORDER — GUAIFENESIN ER 1200 MG PO TB12: 1.0000 | ORAL_TABLET | Freq: Two times a day (BID) | ORAL | Status: DC

## 2015-04-22 MED ORDER — IPRATROPIUM BROMIDE 0.02 % IN SOLN
0.5000 mg | RESPIRATORY_TRACT | Status: AC
  Administered 2015-04-22: 0.5 mg via RESPIRATORY_TRACT
  Filled 2015-04-22: qty 2.5

## 2015-04-22 MED ORDER — PROMETHAZINE-DM 6.25-15 MG/5ML PO SYRP: 5.0000 mL | ORAL_SOLUTION | Freq: Four times a day (QID) | ORAL | Status: DC | PRN

## 2015-04-22 MED ORDER — HYDROCOD POLST-CPM POLST ER 10-8 MG/5ML PO SUER
5.0000 mL | Freq: Once | ORAL | Status: AC
  Administered 2015-04-22: 5 mL via ORAL
  Filled 2015-04-22: qty 5

## 2015-04-22 MED ORDER — ACETAMINOPHEN 500 MG PO TABS
1000.0000 mg | ORAL_TABLET | Freq: Once | ORAL | Status: AC
  Administered 2015-04-22: 1000 mg via ORAL
  Filled 2015-04-22: qty 2

## 2015-04-22 MED FILL — PROMETHAZINE-DM SYRUP: 6.25-15 | 4 days supply | Qty: 120 | Fill #0

## 2015-04-22 NOTE — ED Provider Notes (Signed)
CSN: [id number]     Arrival date & time 04/22/15  1118 History   First MD Initiated Contact with Patient 04/22/15 1203     Chief Complaint  Patient presents with  . Influenza     (Consider location/radiation/quality/duration/timing/severity/associated sxs/prior Treatment) HPI Patient presents to the emergency department with flulike symptoms that started 3 days ago.  Patient states she was seen at her primary care doctor's office yesterday and had a positive flu test.  She states she was given no treatment at home and was not prescribed any medications.  The patient states she has been using her child's nebulizer at home.  The patient states that she has body aches, nasal congestion, cough, sore throat and general weakness.  The patient states that she did take Tylenol for her fever at home.  Patient denies chest pain, shortness of breath, rash, back pain, abdominal pain, diarrhea, nausea, vomiting, headache, blurred vision, lightheadedness, near syncope or syncope.   Past Medical History  Diagnosis Date  . Depression    Past Surgical History  Procedure Laterality Date  . Cesarean section    . Nasal sinus surgery     No family history on file. Social History  Substance Use Topics  . Smoking status: Never Smoker   . Smokeless tobacco: None  . Alcohol Use: No   OB History    Gravida Para Term Preterm AB TAB SAB Ectopic Multiple Living   Review of Systems All other systems negative except as documented in the HPI. All pertinent positives and negatives as reviewed in the HPI.   Allergies  Eggs or egg-derived products; Fluoride preparations; Latex; Onion; Other; and Sulfa antibiotics  Home Medications   Prior to Admission medications   Medication Sig Start Date End Date Taking? Authorizing Provider  amoxicillin (AMOXIL) 500 MG capsule Take 1 capsule (500 mg total) by mouth 2 (two) times daily. 11/03/13   Charm Rings, MD  Cetirizine HCl (ZYRTEC PO) Take 10 mg  by mouth daily.     Historical Provider, MD  cholecalciferol (VITAMIN D) 1000 UNITS tablet Take 1,000 Units by mouth daily.    Historical Provider, MD  ciprofloxacin (CIPRO) 500 MG tablet Take 1 tablet (500 mg total) by mouth 2 (two) times daily. 10/23/14   Elvina Sidle, MD  co-enzyme Q-10 50 MG capsule Take 50 mg by mouth daily.    Historical Provider, MD  EPINEPHrine (EPIPEN) 0.3 mg/0.3 mL DEVI Inject 0.3 mLs (0.3 mg total) into the muscle as needed. 04/04/12   Zadie Rhine, MD  ondansetron (ZOFRAN) 4 MG tablet Take 1 tablet (4 mg total) by mouth every 6 (six) hours. 08/27/14   Robyn M Hess, PA-C  PRENATAL VITAMINS PO Take 1 tablet by mouth daily.     Historical Provider, MD  vitamin B-12 (CYANOCOBALAMIN) 100 MCG tablet Take 100 mcg by mouth daily.    Historical Provider, MD   BP 134/68 mmHg  Pulse 104  Temp(Src) 99.7 F (37.6 C) (Oral)  Resp 20  Ht  (1.575 m)  Wt 76.658 kg  BMI 30.90 kg/m2  SpO2 97%  LMP 03/30/2015 Physical Exam  Constitutional: She is oriented to person, place, and time. She appears well-developed and well-nourished. No distress.  HENT:  Head: Normocephalic and atraumatic.  Mouth/Throat: Oropharynx is clear and moist.  Eyes: Pupils are equal, round, and reactive to light.  Neck: Normal range of motion. Neck supple.  Cardiovascular:  Normal rate, regular rhythm and normal heart sounds.  Exam reveals no gallop and no friction rub.   No murmur heard. Pulmonary/Chest: Effort normal and breath sounds normal. No respiratory distress. She has no wheezes.  Abdominal: Soft. Bowel sounds are normal. She exhibits no distension. There is no tenderness.  Neurological: She is alert and oriented to person, place, and time. She exhibits normal muscle tone. Coordination normal.  Skin: Skin is warm and dry. No rash noted. No erythema.  Psychiatric: She has a normal mood and affect. Her behavior is normal.  Nursing note and vitals reviewed.   ED Course  Procedures  (including critical care time) Labs Review Labs Reviewed - No data to display  Imaging Review Dg Chest 2 View  04/22/2015  CLINICAL DATA:  Patient diagnosed with flu yesterday. Complaining of chest pain, shortness of breath and dizziness today. EXAM: CHEST  2 VIEW COMPARISON:  08/27/2014 FINDINGS: The heart size and mediastinal contours are within normal limits. Both lungs are clear. No pleural effusion or pneumothorax. The visualized skeletal structures are unremarkable. IMPRESSION: No active cardiopulmonary disease. Electronically Signed   By: Amie Portland M.D.   On: 04/22/2015 12:17   I have personally reviewed and evaluated these images and lab results as part of my medical decision-making.  Patient be treated for influenza-like illness.  Patient is given IV fluids here in the emergency department.  Her vital signs remained stable.  She is advised to return here for any worsening in her condition.  The patient agrees the plan and all questions were answered.  Her chest x-ray was reviewed and did not show any acute abnormalities.    Charlestine Night, PA-C 04/22/15 1418  Loren Racer, MD 04/22/15 [phone]

## 2015-04-22 NOTE — ED Notes (Signed)
She had a positive flu test at her MD's office yesterday. Here because she has flu symptoms.

## 2015-04-22 NOTE — Discharge Instructions (Signed)
Return here as needed.  Follow-up with your primary care Dr. increase her fluid intake and rest as much as possible °

## 2015-05-27 DIAGNOSIS — F331 Major depressive disorder, recurrent, moderate: Secondary | ICD-10-CM | POA: Insufficient documentation

## 2015-08-19 ENCOUNTER — Emergency Department (HOSPITAL_BASED_OUTPATIENT_CLINIC_OR_DEPARTMENT_OTHER): Payer: Medicaid Other

## 2015-08-19 ENCOUNTER — Emergency Department (HOSPITAL_BASED_OUTPATIENT_CLINIC_OR_DEPARTMENT_OTHER)
Admission: EM | Admit: 2015-08-19 | Discharge: 2015-08-19 | Disposition: A | Discharge status: Home / Self Care | Payer: Medicaid Other | Attending: Emergency Medicine | Admitting: Emergency Medicine

## 2015-08-19 ENCOUNTER — Encounter (HOSPITAL_BASED_OUTPATIENT_CLINIC_OR_DEPARTMENT_OTHER): Payer: Self-pay

## 2015-08-19 DIAGNOSIS — F329 Major depressive disorder, single episode, unspecified: Secondary | ICD-10-CM | POA: Insufficient documentation

## 2015-08-19 DIAGNOSIS — F430 Acute stress reaction: Secondary | ICD-10-CM

## 2015-08-19 DIAGNOSIS — F439 Reaction to severe stress, unspecified: Secondary | ICD-10-CM | POA: Diagnosis not present

## 2015-08-19 DIAGNOSIS — R0789 Other chest pain: Secondary | ICD-10-CM | POA: Diagnosis not present

## 2015-08-19 DIAGNOSIS — Z79899 Other long term (current) drug therapy: Secondary | ICD-10-CM | POA: Diagnosis not present

## 2015-08-19 LAB — BASIC METABOLIC PANEL
Anion gap: 9 (ref 5–15)
BUN: 10 mg/dL (ref 6–20)
CALCIUM: 9 mg/dL (ref 8.9–10.3)
CO2: 23 mmol/L (ref 22–32)
Chloride: 106 mmol/L (ref 101–111)
Creatinine, Ser: 0.63 mg/dL (ref 0.44–1.00)
GLUCOSE: 103 mg/dL — AB (ref 65–99)
Potassium: 4 mmol/L (ref 3.5–5.1)
Sodium: 138 mmol/L (ref 135–145)

## 2015-08-19 LAB — CBC WITH DIFFERENTIAL/PLATELET
BASOS PCT: 0 %
Basophils Absolute: 0 10*3/uL (ref 0.0–0.1)
EOS PCT: 2 %
Eosinophils Absolute: 0.1 10*3/uL (ref 0.0–0.7)
HCT: 40.3 % (ref 36.0–46.0)
Hemoglobin: 13.8 g/dL (ref 12.0–15.0)
Lymphocytes Relative: 23 %
Lymphs Abs: 1.6 10*3/uL (ref 0.7–4.0)
MCH: 30.4 pg (ref 26.0–34.0)
MCHC: 34.2 g/dL (ref 30.0–36.0)
MCV: 88.8 fL (ref 78.0–100.0)
MONO ABS: 0.4 10*3/uL (ref 0.1–1.0)
Monocytes Relative: 6 %
Neutro Abs: 4.9 10*3/uL (ref 1.7–7.7)
Neutrophils Relative %: 69 %
PLATELETS: 254 10*3/uL (ref 150–400)
RBC: 4.54 MIL/uL (ref 3.87–5.11)
RDW: 13.2 % (ref 11.5–15.5)
WBC: 7 10*3/uL (ref 4.0–10.5)

## 2015-08-19 LAB — TROPONIN I: Troponin I: <0.03 ng/mL (ref <0.031)

## 2015-08-19 MED ORDER — KETOROLAC TROMETHAMINE 15 MG/ML IJ SOLN
15.0000 mg | Freq: Once | INTRAMUSCULAR | Status: AC
  Administered 2015-08-19: 15 mg via INTRAVENOUS
  Filled 2015-08-19: qty 1

## 2015-08-19 NOTE — ED Notes (Signed)
C/o CP last week and again today-was seen by PCP today-no tests done-dx with sinus infection with rx abx-pt also c/o HA and states it started after she was upset/crying-taken to tx area via w/c

## 2015-08-19 NOTE — ED Provider Notes (Addendum)
CSN: [id number]     Arrival date & time 08/19/15  1614 History   First MD Initiated Contact with Patient 08/19/15 1617     Chief Complaint  Patient presents with  . Chest Pain     (Consider location/radiation/quality/duration/timing/severity/associated sxs/prior Treatment) HPI Comments: Patient is a 45 year old female with a history of depression and hypertension presenting today with complaint of chest pain. Patient states a proximally one hour prior to arrival she became very upset and was crying uncontrollably when she developed a squeezing tight chest discomfort acrossed her chest radiating into her right armpit.  At the time she felt slightly short of breath which is now resolved but the pain has continued. She is still very upset but does not want to talk about it.  She denies any oral contraceptives or hormones, unilateral leg pain or swelling, recent travel or surgeries. No recent cough or congestion but states she has had nasal pressure, left-sided sinus congestion and started on amoxicillin today for a sinus infection. No recent cough or shortness of breath.  Patient does have a history of having chest pain and had a stress echo done approximately 1 year ago that was normal. Also patient notes possibly one week ago she had left-sided chest pain radiating into the jaw and arm which lasted approximately one day and was not associated with exertion. It went away spontaneously and she has not had it since. The pain today is not similar to the pain she experienced one week ago.  Patient is not a smoker and has no prior cardiac history.  Patient is a 45 y.o. female presenting with chest pain. The history is provided by the patient.  Chest Pain   Past Medical History  Diagnosis Date  . Depression    Past Surgical History  Procedure Laterality Date  . Cesarean section    . Nasal sinus surgery     No family history on file. Social History  Substance Use Topics  . Smoking status: Never  Smoker   . Smokeless tobacco: None  . Alcohol Use: No   OB History    Gravida Para Term Preterm AB TAB SAB Ectopic Multiple Living   6 3   2  2         Review of Systems  Cardiovascular: Positive for chest pain.  All other systems reviewed and are negative.     Allergies  Eggs or egg-derived products; Fluoride preparations; Latex; Onion; Other; and Sulfa antibiotics  Home Medications   Prior to Admission medications   Medication Sig Start Date End Date Taking? Authorizing Provider  AMLODIPINE BESYLATE PO Take by mouth.   Yes Historical Provider, MD  CITALOPRAM HYDROBROMIDE PO Take by mouth.   Yes Historical Provider, MD  amoxicillin (AMOXIL) 500 MG capsule Take 1 capsule (500 mg total) by mouth 2 (two) times daily. 11/03/13   Charm Rings, MD  Cetirizine HCl (ZYRTEC PO) Take 10 mg by mouth daily.     Historical Provider, MD  EPINEPHrine (EPIPEN) 0.3 mg/0.3 mL DEVI Inject 0.3 mLs (0.3 mg total) into the muscle as needed. 04/04/12   Zadie Rhine, MD  ibuprofen (ADVIL,MOTRIN) 800 MG tablet Take 1 tablet (800 mg total) by mouth every 8 (eight) hours as needed. 04/22/15   Christopher Lawyer, PA-C  ondansetron (ZOFRAN) 4 MG tablet Take 1 tablet (4 mg total) by mouth every 6 (six) hours. 08/27/14   Robyn M Hess, PA-C   BP 161/89 mmHg  Pulse 75  Temp(Src) 98.6 F (37  C) (Oral)  Resp 20  Ht  (1.575 m)  Wt 170 lb (77.111 kg)  BMI 31.09 kg/m2  SpO2 99%  LMP 08/04/2015 Physical Exam  Constitutional: She is oriented to person, place, and time. She appears well-developed and well-nourished. No distress.  HENT:  Head: Normocephalic and atraumatic.  Mouth/Throat: Oropharynx is clear and moist.  Eyes: Conjunctivae and EOM are normal. Pupils are equal, round, and reactive to light.  Neck: Normal range of motion. Neck supple.  Cardiovascular: Normal rate, regular rhythm and intact distal pulses.   No murmur heard. Pulmonary/Chest: Effort normal and breath sounds normal. No  respiratory distress. She has no wheezes. She has no rales. She exhibits tenderness.  Mild reproducible chest pain with palpation over the sternum  Abdominal: Soft. She exhibits no distension. There is no tenderness. There is no rebound and no guarding.  Musculoskeletal: Normal range of motion. She exhibits no edema or tenderness.  No unilateral leg pain or swelling  Neurological: She is alert and oriented to person, place, and time.  Skin: Skin is warm and dry. No rash noted. No erythema.  Psychiatric: She has a normal mood and affect. Her behavior is normal.  Nursing note and vitals reviewed.   ED Course  Procedures (including critical care time) Labs Review Labs Reviewed  BASIC METABOLIC PANEL - Abnormal; Notable for the following:    Glucose, Bld 103 (*)    All other components within normal limits  CBC WITH DIFFERENTIAL/PLATELET  TROPONIN I    Imaging Review Dg Chest 2 View  08/19/2015  CLINICAL DATA:  Chest pain EXAM: CHEST  2 VIEW COMPARISON:  04/22/2015 FINDINGS: The heart size and mediastinal contours are within normal limits. Both lungs are clear. The visualized skeletal structures are unremarkable. IMPRESSION: No active cardiopulmonary disease. Electronically Signed   By: Alcide Clever M.D.   On: 08/19/2015 17:38   I have personally reviewed and evaluated these images and lab results as part of my medical decision-making.   EKG Interpretation   Date/Time:  Tuesday Aug 19 2015 16:22:13 EDT Ventricular Rate:  78 PR Interval:  133 QRS Duration: 92 QT Interval:  388 QTC Calculation: 442 R Axis:   46 Text Interpretation:  Sinus rhythm Normal ECG Confirmed by Anitra Lauth  MD,  Alphonzo Lemmings ([id number]) on 08/19/2015 4:28:30 PM Also confirmed by Anitra Lauth  MD,  Rubby Barbary ([id number]), editor [place] CT, Jola Babinski [phone])  on 08/19/2015 4:43:47 PM      MDM   Final diagnoses:  Atypical chest pain  Stress response    Pt with atypical story for CP starting today after a stressful event where  she was crying. Heart score of 1 for HTN .  PERC neg.  EKG wnl.  CXR, CBC, BMP, trop all wnl.  Patient states parsley 1 year ago she had a stress echo done which was normal. She had at that time for chest pain.\ Low suspicion for PE, dissection or infectious pulmonary pathology. No risk factors concerning for endocarditis. Feel most likely this was stress induced. Patient does state she recently had a sinus infection and started on amoxicillin today however low likelihood that is related to patient's symptoms today.  Patient does state last week she had an episode of chest pain that radiated into her left shoulder but went away after day and has not returned. The pain she had today is not similar to that. She denies any GI symptoms. All labs are within normal limits. Encourage patient to follow-up with her regular doctor  and possibly cardiology but at this time feel that she can be discharged home.     Gwyneth SproutWhitney Renia Mikelson, MD 08/19/15 [id number]  Gwyneth SproutWhitney Gae Bihl, MD 08/19/15 [id number]

## 2016-02-09 ENCOUNTER — Encounter: Payer: Self-pay | Admitting: Neurology

## 2016-02-09 ENCOUNTER — Ambulatory Visit (INDEPENDENT_AMBULATORY_CARE_PROVIDER_SITE_OTHER): Payer: Medicaid Other | Admitting: Neurology

## 2016-02-09 DIAGNOSIS — M542 Cervicalgia: Secondary | ICD-10-CM

## 2016-02-09 DIAGNOSIS — R202 Paresthesia of skin: Secondary | ICD-10-CM | POA: Diagnosis not present

## 2016-02-09 DIAGNOSIS — H538 Other visual disturbances: Secondary | ICD-10-CM

## 2016-02-09 MED ORDER — DULOXETINE HCL 60 MG PO CPEP: 60.0000 mg | ORAL_CAPSULE | Freq: Every day | ORAL | 11 refills | Status: DC
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
## 2016-02-11 LAB — CBC WITH DIFFERENTIAL/PLATELET
BASOS ABS: 0 10*3/uL (ref 0.0–0.2)
Basos: 0 %
EOS (ABSOLUTE): 0.2 10*3/uL (ref 0.0–0.4)
Eos: 2 %
Hematocrit: 39.7 % (ref 34.0–46.6)
Hemoglobin: 13.2 g/dL (ref 11.1–15.9)
Immature Grans (Abs): 0 10*3/uL (ref 0.0–0.1)
Immature Granulocytes: 0 %
LYMPHS ABS: 2 10*3/uL (ref 0.7–3.1)
Lymphs: 31 %
MCH: 30.7 pg (ref 26.6–33.0)
MCHC: 33.2 g/dL (ref 31.5–35.7)
MCV: 92 fL (ref 79–97)
Monocytes Absolute: 0.5 10*3/uL (ref 0.1–0.9)
Monocytes: 8 %
NEUTROS ABS: 3.8 10*3/uL (ref 1.4–7.0)
Neutrophils: 59 %
PLATELETS: 304 10*3/uL (ref 150–379)
RBC: 4.3 x10E6/uL (ref 3.77–5.28)
RDW: 13.6 % (ref 12.3–15.4)
WBC: 6.5 10*3/uL (ref 3.4–10.8)

## 2016-02-11 LAB — PROTEIN ELECTROPHORESIS
A/G Ratio: 1.2 (ref 0.7–1.7)
ALBUMIN ELP: 3.8 g/dL (ref 2.9–4.4)
Alpha 1: 0.2 g/dL (ref 0.0–0.4)
Alpha 2: 0.9 g/dL (ref 0.4–1.0)
BETA: 1.2 g/dL (ref 0.7–1.3)
Gamma Globulin: 0.9 g/dL (ref 0.4–1.8)
Globulin, Total: 3.2 g/dL (ref 2.2–3.9)

## 2016-02-11 LAB — COMPREHENSIVE METABOLIC PANEL
A/G RATIO: 1.5 (ref 1.2–2.2)
ALBUMIN: 4.2 g/dL (ref 3.5–5.5)
ALK PHOS: 64 IU/L (ref 39–117)
ALT: 16 IU/L (ref 0–32)
AST: 17 IU/L (ref 0–40)
BILIRUBIN TOTAL: 0.3 mg/dL (ref 0.0–1.2)
BUN / CREAT RATIO: 19 (ref 9–23)
BUN: 10 mg/dL (ref 6–24)
CHLORIDE: 101 mmol/L (ref 96–106)
CO2: 23 mmol/L (ref 18–29)
Calcium: 9.1 mg/dL (ref 8.7–10.2)
Creatinine, Ser: 0.54 mg/dL — ABNORMAL LOW (ref 0.57–1.00)
GFR calc non Af Amer: 114 mL/min/{1.73_m2} (ref >59)
GFR, EST AFRICAN AMERICAN: 132 mL/min/{1.73_m2} (ref >59)
Globulin, Total: 2.8 g/dL (ref 1.5–4.5)
Glucose: 85 mg/dL (ref 65–99)
POTASSIUM: 4.2 mmol/L (ref 3.5–5.2)
Sodium: 140 mmol/L (ref 134–144)
TOTAL PROTEIN: 7 g/dL (ref 6.0–8.5)

## 2016-02-11 LAB — COPPER, SERUM: COPPER: 107 ug/dL (ref 72–166)

## 2016-02-11 LAB — FOLATE: Folate: 15.7 ng/mL (ref >3.0)

## 2016-02-11 LAB — ANA W/REFLEX IF POSITIVE: ANA: NEGATIVE

## 2016-02-11 LAB — RPR: RPR Ser Ql: NONREACTIVE

## 2016-02-11 LAB — VITAMIN D 25 HYDROXY (VIT D DEFICIENCY, FRACTURES): VIT D 25 HYDROXY: 21 ng/mL — AB (ref 30.0–100.0)

## 2016-02-11 LAB — HGB A1C W/O EAG: Hgb A1c MFr Bld: 5.4 % (ref 4.8–5.6)

## 2016-02-11 LAB — TSH: TSH: 1.85 u[IU]/mL (ref 0.450–4.500)

## 2016-02-11 LAB — VITAMIN B12: Vitamin B-12: 367 pg/mL (ref 211–946)

## 2016-02-11 LAB — FERRITIN: Ferritin: 95 ng/mL (ref 15–150)

## 2016-02-11 LAB — C-REACTIVE PROTEIN: CRP: 7.6 mg/L — ABNORMAL HIGH (ref 0.0–4.9)

## 2016-02-11 LAB — CK: Total CK: 78 U/L (ref 24–173)

## 2016-02-17 ENCOUNTER — Other Ambulatory Visit: Payer: Self-pay | Admitting: Neurology

## 2016-02-22 ENCOUNTER — Other Ambulatory Visit: Payer: Medicaid Other

## 2016-02-22 ENCOUNTER — Inpatient Hospital Stay: Admission: RE | Admit: 2016-02-22 | Payer: Medicaid Other | Source: Ambulatory Visit

## 2016-03-07 ENCOUNTER — Ambulatory Visit
Admission: RE | Admit: 2016-03-07 | Discharge: 2016-03-07 | Disposition: A | Discharge status: Home / Self Care | Payer: Medicaid Other | Source: Ambulatory Visit | Attending: Neurology | Admitting: Neurology

## 2016-03-07 DIAGNOSIS — H538 Other visual disturbances: Secondary | ICD-10-CM

## 2016-03-07 DIAGNOSIS — M542 Cervicalgia: Secondary | ICD-10-CM

## 2016-03-07 DIAGNOSIS — R202 Paresthesia of skin: Secondary | ICD-10-CM | POA: Diagnosis not present

## 2016-04-14 ENCOUNTER — Telehealth: Payer: Self-pay | Admitting: Neurology

## 2016-04-14 ENCOUNTER — Encounter: Payer: Self-pay | Admitting: Neurology

## 2016-04-14 ENCOUNTER — Ambulatory Visit (INDEPENDENT_AMBULATORY_CARE_PROVIDER_SITE_OTHER): Payer: Medicaid Other | Admitting: Neurology

## 2016-04-14 VITALS — BP 137/87 | HR 86 | Ht 62.0 in | Wt 169.5 lb

## 2016-04-14 DIAGNOSIS — G8929 Other chronic pain: Secondary | ICD-10-CM | POA: Diagnosis not present

## 2016-04-14 DIAGNOSIS — R202 Paresthesia of skin: Secondary | ICD-10-CM

## 2016-04-14 DIAGNOSIS — M545 Low back pain, unspecified: Secondary | ICD-10-CM

## 2016-04-14 MED ORDER — LIDOCAINE HCL 2 % EX GEL: 1.0000 "application " | CUTANEOUS | 11 refills | Status: DC | PRN

## 2016-04-14 MED ORDER — NORTRIPTYLINE HCL 50 MG PO CAPS: 50.0000 mg | ORAL_CAPSULE | Freq: Every day | ORAL | 11 refills | Status: DC

## 2016-04-14 MED ORDER — CYCLOBENZAPRINE HCL 10 MG PO TABS: 10.0000 mg | ORAL_TABLET | Freq: Three times a day (TID) | ORAL | 6 refills | Status: AC | PRN

## 2016-04-14 NOTE — Telephone Encounter (Signed)
Brooke SoursGreg with [facility] is calling regarding a possible drug interaction between medication nortriptyline (PAMELOR) 50 MG capsule and lopram (CELEXA) 40 MG tablet which is prescribed by another physician.
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
## 2016-04-14 NOTE — Telephone Encounter (Addendum)
Spoke to Dr. Terrace ArabiaYan - she will further review and make a decision.  Dr. Terrace ArabiaYan has discontinued nortriptyline and would like to verify her gabapentin dosage for a potential increase in this medication.

## 2016-04-15 ENCOUNTER — Encounter: Payer: Self-pay | Admitting: *Deleted

## 2016-04-15 NOTE — Addendum Note (Signed)
Addended by: Levert FeinsteinYAN, Dio Giller on: 04/15/2016 08:49 AM   Modules accepted: Orders

## 2016-04-15 NOTE — Telephone Encounter (Signed)
Patient is taking gabapentin 300mg , 2 capsules at bedtime.  Per vo by Dr. Terrace ArabiaYan, she can increase her dose to 3 capsules at bedtime, when needed.  States she has plenty of medication at home and does not need a refill at this point.

## 2016-04-15 NOTE — Telephone Encounter (Signed)
Patient returning nurses call

## 2016-04-15 NOTE — Telephone Encounter (Addendum)
Left message for patient to return my call.

## 2016-04-27 ENCOUNTER — Encounter (HOSPITAL_BASED_OUTPATIENT_CLINIC_OR_DEPARTMENT_OTHER): Payer: Self-pay

## 2016-04-27 ENCOUNTER — Telehealth: Payer: Self-pay | Admitting: Neurology

## 2016-04-27 ENCOUNTER — Emergency Department (HOSPITAL_BASED_OUTPATIENT_CLINIC_OR_DEPARTMENT_OTHER)
Admission: EM | Admit: 2016-04-27 | Discharge: 2016-04-27 | Disposition: A | Discharge status: Home / Self Care | Payer: Medicaid Other | Attending: Emergency Medicine | Admitting: Emergency Medicine

## 2016-04-27 ENCOUNTER — Emergency Department (HOSPITAL_BASED_OUTPATIENT_CLINIC_OR_DEPARTMENT_OTHER): Payer: Medicaid Other

## 2016-04-27 DIAGNOSIS — J0100 Acute maxillary sinusitis, unspecified: Secondary | ICD-10-CM

## 2016-04-27 DIAGNOSIS — I10 Essential (primary) hypertension: Secondary | ICD-10-CM | POA: Diagnosis not present

## 2016-04-27 DIAGNOSIS — R0981 Nasal congestion: Secondary | ICD-10-CM | POA: Diagnosis present

## 2016-04-27 DIAGNOSIS — Z79899 Other long term (current) drug therapy: Secondary | ICD-10-CM | POA: Insufficient documentation

## 2016-04-27 MED ORDER — DEXAMETHASONE SODIUM PHOSPHATE 10 MG/ML IJ SOLN
10.0000 mg | Freq: Once | INTRAMUSCULAR | Status: AC
  Administered 2016-04-27: 10 mg via INTRAMUSCULAR
  Filled 2016-04-27: qty 1

## 2016-04-27 MED ORDER — KETOROLAC TROMETHAMINE 30 MG/ML IJ SOLN
30.0000 mg | Freq: Once | INTRAMUSCULAR | Status: AC
  Administered 2016-04-27: 30 mg via INTRAMUSCULAR
  Filled 2016-04-27: qty 1

## 2016-04-27 MED ORDER — AMOXICILLIN-POT CLAVULANATE 875-125 MG PO TABS: 1.0000 | ORAL_TABLET | Freq: Two times a day (BID) | ORAL | 0 refills | Status: DC
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
## 2016-04-27 NOTE — ED Provider Notes (Signed)
[facility] Provider Note   CSN: [id number] Arrival date & time: 04/27/16  1646   By signing my name below, I, Soijett Blue, attest that this documentation has been prepared under the direction and in the presence of Audry Pili, PA-C Electronically Signed: Soijett Blue, ED Scribe. 04/27/16. 6:23 PM.  History   Chief Complaint Chief Complaint  Patient presents with  . Cough    HPI Brooke Cruz is a 46 y.o. female with a PMHx of HTN, who presents to the Emergency Department complaining of productive cough onset 2 weeks ago worsening 3-4 days ago. She is having associated symptoms of right ear pressure, nasal congestion, rhinorrhea, post-nasal drip, nausea, and HA. She has tried nettie pot without medications with no relief for her symptoms. She denies fever, chills, vomiting, diarrhea, and any other symptoms. Denies sick contacts. Pt is allergic to sulfa drugs. Denies hx of DM.   Pt secondarily complains mid back pain onset 3-4 days ago. Pt notes that she sees a spine specialist for her chronic lower back pain. Pt hasn't tried any medications for the relief of her symptoms. Pt denies bowel/bladder incontinence and any other symptoms.    The history is provided by the patient. No language interpreter was used.    Past Medical History:  Diagnosis Date  . Anxiety and depression   . Chronic back pain   . Depression   . Hyperlipemia   . Hypertension   . Peripheral neuropathy (HCC)   . PTSD (post-traumatic stress disorder)     Patient Active Problem List   Diagnosis Date Noted  . Chronic left-sided low back pain 04/14/2016  . Paresthesia 02/09/2016  . Blurred vision 02/09/2016  . Neck pain on right side 02/09/2016    Past Surgical History:  Procedure Laterality Date  . CESAREAN SECTION     x 2  . NASAL SINUS SURGERY      OB History    Gravida Para Term Preterm AB Living   6 3     2      SAB TAB Ectopic Multiple Live Births   2               Home  Medications    Prior to Admission medications   Medication Sig Start Date End Date Taking? Authorizing Provider  amLODipine (NORVASC) 5 MG tablet daily. 12/17/15   Historical Provider, MD  busPIRone (BUSPAR) 10 MG tablet Take 10 mg by mouth 2 (two) times daily.    Historical Provider, MD  cetirizine (ZYRTEC) 10 MG tablet Take by mouth daily.    Historical Provider, MD  citalopram (CELEXA) 40 MG tablet daily. 07/29/15   Historical Provider, MD  Cyanocobalamin (VITAMIN B-12 PO) Take by mouth daily.    Historical Provider, MD  cyclobenzaprine (FLEXERIL) 10 MG tablet Take 1 tablet (10 mg total) by mouth 3 (three) times daily as needed. 04/14/16   Levert Feinstein, MD  DULoxetine (CYMBALTA) 60 MG capsule Take 1 capsule (60 mg total) by mouth daily. 02/09/16   Levert Feinstein, MD  EPINEPHrine (EPIPEN) 0.3 mg/0.3 mL DEVI Inject 0.3 mLs (0.3 mg total) into the muscle as needed. 04/04/12   Zadie Rhine, MD  gabapentin (NEURONTIN) 300 MG capsule TK 2 CS PO D HS 01/20/16   Historical Provider, MD  ibuprofen (ADVIL,MOTRIN) 800 MG tablet Take 1 tablet (800 mg total) by mouth every 8 (eight) hours as needed. 04/22/15   Christopher Lawyer, PA-C  lidocaine (XYLOCAINE) 2 % jelly Apply 1 application topically as  needed. As needed for bilateral feet paresthesia 04/14/16   Levert FeinsteinYijun Yan, MD  meloxicam (MOBIC) 15 MG tablet Take by mouth daily. 10/03/15 10/02/16  Historical Provider, MD  ondansetron (ZOFRAN) 4 MG tablet Take 1 tablet (4 mg total) by mouth every 6 (six) hours. 08/27/14   Kathrynn Speedobyn M Hess, PA-C    Family History Family History  Problem Relation Age of Onset  . Hypertension Mother   . Fibromyalgia Mother   . Schizophrenia Father   . Schizophrenia Sister   . Colon cancer Maternal Grandmother   . Heart disease Maternal Grandfather   . Breast cancer Maternal Aunt     Social History Social History  Substance Use Topics  . Smoking status: Never Smoker  . Smokeless tobacco: Never Used  . Alcohol use No     Allergies    Eggs or egg-derived products; Latex; Onion; Other; Sulfa antibiotics; and Corn oil   Review of Systems Review of Systems  Constitutional: Negative for chills and fever.  HENT: Positive for congestion, postnasal drip and rhinorrhea.        +right ear pressure  Respiratory: Positive for cough.   Gastrointestinal: Positive for nausea. Negative for diarrhea and vomiting.  Musculoskeletal: Positive for back pain (mid).  Neurological: Positive for headaches.   A complete 10 system review of systems was obtained and all systems are negative except as noted in the HPI and PMH.   Physical Exam Updated Vital Signs BP 155/93 (BP Location: Left Arm)   Pulse 93   Temp 98.7 F (37.1 C) (Oral)   Resp 18   Ht 5\' 2"  (1.575 m)   Wt 170 lb (77.1 kg)   LMP 03/29/2016   SpO2 99%   BMI 31.09 kg/m   Physical Exam  Constitutional: She is oriented to person, place, and time. She appears well-developed and well-nourished. No distress.  HENT:  Head: Normocephalic and atraumatic.  Nose: Right sinus exhibits maxillary sinus tenderness. Right sinus exhibits no frontal sinus tenderness. Left sinus exhibits no maxillary sinus tenderness and no frontal sinus tenderness.  Eyes: EOM are normal.  Neck: Neck supple.  Cardiovascular: Normal rate.   Pulmonary/Chest: Effort normal. No respiratory distress.  Abdominal: She exhibits no distension.  Musculoskeletal: Normal range of motion.       Lumbar back: She exhibits tenderness. She exhibits no bony tenderness.  No midline spinal tenderness. Left lumbar paraspinal tenderness.   Neurological: She is alert and oriented to person, place, and time.  Skin: Skin is warm and dry.  Psychiatric: She has a normal mood and affect. Her behavior is normal.  Nursing note and vitals reviewed.  ED Treatments / Results  DIAGNOSTIC STUDIES: Oxygen Saturation is 99% on RA, nl by my interpretation.    COORDINATION OF CARE: 6:19 PM Discussed treatment plan with pt at  bedside which includes CXR, toradol injection, decadron injection, and pt agreed to plan.   Radiology Dg Chest 2 View  Result Date: 04/27/2016 CLINICAL DATA:  Cough and headache. EXAM: CHEST  2 VIEW COMPARISON:  08/19/2015 FINDINGS: The heart size and mediastinal contours are within normal limits. Both lungs are clear. The visualized skeletal structures are unremarkable. IMPRESSION: No active cardiopulmonary disease. Electronically Signed   By: Richarda OverlieAdam  Henn M.D.   On: 04/27/2016 18:24    Procedures Procedures (including critical care time)  Medications Ordered in ED Medications  dexamethasone (DECADRON) injection 10 mg (10 mg Intramuscular Given 04/27/16 1826)  ketorolac (TORADOL) 30 MG/ML injection 30 mg (30 mg Intramuscular Given  04/27/16 1827)     Initial Impression / Assessment and Plan / ED Course  I have reviewed the triage vital signs and the nursing notes.  Pertinent imaging results that were available during my care of the patient were reviewed by me and considered in my medical decision making (see chart for details).   {I have reviewed and evaluated the relevant imaging studies. {I have reviewed the relevant previous healthcare records. {I obtained HPI from historian.  ED Course:  Assessment: Pt is a 46 year old female presents with symptoms consistent with sinusitis . On exam, pt in NAD. VSS. Afebrile. Lungs CTA, Heart RRR. Abdomen nontender/soft. Pt CXR negative for acute infiltrate. Pt will be discharged with Augmentin for sinusitis due to duration of symptoms.  Verbalizes understanding and is agreeable with plan. Pt is hemodynamically stable & in NAD prior to dc.  Disposition/Plan:  DC home Additional Verbal discharge instructions given and discussed with patient.  Pt Instructed to f/u with PCP in the next week for evaluation and treatment of symptoms. Return precautions given Pt acknowledges and agrees with plan  Supervising Physician Canary Brim Tegeler,  MD  Final Clinical Impressions(s) / ED Diagnoses   Final diagnoses:  Acute non-recurrent maxillary sinusitis    New Prescriptions New Prescriptions   No medications on file    I personally performed the services described in this documentation, which was scribed in my presence. The recorded information has been reviewed and is accurate.    Audry Pili, PA-C 04/27/16 1851    Canary Brim Tegeler, MD 04/28/16 [phone]

## 2016-04-27 NOTE — Telephone Encounter (Signed)
This is a Dr. Terrace ArabiaYan patient. Medicaid did not approve the MRI the case number is [id number] and the phone number for the peer to peer is [phone]. She is schedule to have [facility] test [facility] on Thursday 04/29/16.

## 2016-04-27 NOTE — ED Triage Notes (Signed)
C/o cough, sinus congestion, back pain x 2 weeks-NAD-steady gait
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
## 2016-04-29 ENCOUNTER — Ambulatory Visit
Admission: RE | Admit: 2016-04-29 | Discharge: 2016-04-29 | Disposition: A | Discharge status: Home / Self Care | Payer: Medicaid Other | Source: Ambulatory Visit | Attending: Neurology | Admitting: Neurology

## 2016-04-29 DIAGNOSIS — M545 Low back pain: Secondary | ICD-10-CM

## 2016-04-29 DIAGNOSIS — G8929 Other chronic pain: Secondary | ICD-10-CM | POA: Diagnosis not present

## 2016-04-29 DIAGNOSIS — R202 Paresthesia of skin: Secondary | ICD-10-CM

## 2016-04-29 NOTE — Telephone Encounter (Signed)
Noted, thank you I informed Alvino Chapelllen at [facility].

## 2016-05-03 ENCOUNTER — Telehealth: Payer: Self-pay | Admitting: Neurology

## 2016-05-03 NOTE — Telephone Encounter (Signed)
Brooke Cruz - Please discuss the following with patient. MRI Lumbar spine shows some mild arthritic changes but nothing concerning and no apparent nerve pinching. In fact there has been no changes since her last MRI in 2015. I will cc Dr. Terrace ArabiaYan on this correspondence as well so she can follow up when she gets back. Thanks   IMPRESSION:  This MRI of the lumbar spine without contrast shows the following: 1.   At L4-L5, there is mild left facet hypertrophy but there does not appear to be any nerve root compression. 2.   At L5-S1, there is right greater than left facet hypertrophy causing mild right foraminal narrowing but no nerve root compression 3.   Compared to the MRI dated 11/23/2013, there is no interval change.

## 2016-05-04 NOTE — Telephone Encounter (Signed)
Left message for a return call

## 2016-05-04 NOTE — Telephone Encounter (Signed)
Spoke to patient - she is aware of results and will keep her pending appts. 

## 2016-06-04 ENCOUNTER — Encounter: Payer: Medicaid Other | Admitting: Neurology

## 2016-06-07 ENCOUNTER — Encounter: Payer: Self-pay | Admitting: Neurology

## 2016-07-30 ENCOUNTER — Ambulatory Visit (INDEPENDENT_AMBULATORY_CARE_PROVIDER_SITE_OTHER): Payer: Medicaid Other | Admitting: Neurology

## 2016-07-30 ENCOUNTER — Encounter (INDEPENDENT_AMBULATORY_CARE_PROVIDER_SITE_OTHER): Payer: Self-pay | Admitting: Neurology

## 2016-07-30 DIAGNOSIS — M545 Low back pain: Secondary | ICD-10-CM

## 2016-07-30 DIAGNOSIS — G8929 Other chronic pain: Secondary | ICD-10-CM

## 2016-07-30 DIAGNOSIS — R202 Paresthesia of skin: Secondary | ICD-10-CM

## 2016-07-30 DIAGNOSIS — Z0289 Encounter for other administrative examinations: Secondary | ICD-10-CM

## 2016-07-30 MED ORDER — DULOXETINE HCL 60 MG PO CPEP: 60.0000 mg | ORAL_CAPSULE | Freq: Every day | ORAL | 4 refills | Status: AC

## 2016-07-30 MED ORDER — GABAPENTIN 300 MG PO CAPS: 600.0000 mg | ORAL_CAPSULE | Freq: Two times a day (BID) | ORAL | 4 refills | Status: DC

## 2016-07-30 NOTE — Procedures (Signed)
Full Name: Brooke Cruz Gender: Female MRN #: [id number] Date of Birth: 1971-02-22    Visit Date: 07/30/2016 08:03 Age: 46 Years 5 Months Old Examining Physician: Levert Feinstein, MD  Referring Physician: Terrace Arabia, MD History: 46 year old right-handed female complains of chronic neck, low back pain, bilateral lower extremity paresthesia  Summary of the test: Nerve conduction study: Bilateral lower extremity motor and sensory nerve conduction studies were normal. H reflexes were normal and symmetric.   Electromyography: Selected needle examination of bilateral lower extremity and bilateral lumbosacral paraspinals were normal.  Conclusion: This is a normal study. There is no electrodiagnostic evidence of large fiber peripheral neuropathy or bilateral lumbosacral radiculopathy.    ------------------------------- Levert Feinstein, M.D.  [facility] [address] [place], [place] [address] Tel: [phone] Fax: [phone]        [facility]    Nerve / Sites Muscle Latency Ref. Amplitude Ref. Rel Amp Segments Distance Velocity Ref. Area    ms ms mV mV %  cm m/s m/s mVms  L Peroneal - EDB     Ankle EDB 5.1 ?6.5 2.4 ?2.0 100 Ankle - EDB 9   8.8     Fib head EDB 10.6  2.0  85.7 Fib head - Ankle 26 47 ?44 8.6     Pop fossa EDB 12.6  2.0  101 Pop fossa - Fib head 10 51 ?44 8.9         Pop fossa - Ankle      R Peroneal - EDB     Ankle EDB 4.9 ?6.5 2.0 ?2.0 100 Ankle - EDB 9   9.0     Fib head EDB 10.8  1.9  94.7 Fib head - Ankle 26 44 ?44 8.5     Pop fossa EDB 12.9  1.6  86.3 Pop fossa - Fib head 10 49 ?44 7.8         Pop fossa - Ankle      L Tibial - AH     Ankle AH 4.5 ?5.8 16.1 ?4.0 100 Ankle - AH 9   51.6     Pop fossa AH 12.9  11.7  72.3 Pop fossa - Ankle 37 44 ?41 37.5  R Tibial - AH     Ankle AH 3.8 ?5.8 16.7 ?4.0 100 Ankle - AH 9   51.7     Pop fossa AH 11.4  14.9  89.3 Pop fossa - Ankle 37 49 ?41 48.7             SNC    Nerve / Sites Rec. Site Peak Lat  Ref.  Amp Ref. Segments Distance    ms ms V V  cm  L Sural - Ankle (Calf)     Calf Ankle 3.6 ?4.4 15 ?6 Calf - Ankle 14  R Sural - Ankle (Calf)     Calf Ankle 3.0 ?4.4 23 ?6 Calf - Ankle 14  L Superficial peroneal - Ankle     Lat leg Ankle 3.7 ?4.4 6 ?6 Lat leg - Ankle 14  R Superficial peroneal - Ankle     Lat leg Ankle 3.9 ?4.4 11 ?6 Lat leg - Ankle 14             F  Wave    Nerve F Lat Ref.   ms ms  L Tibial - AH 46.6 ?56.0  R Tibial - AH 46.3 ?56.0         H Reflex  Nerve H Lat Lat Hmax   ms ms   Left Right Ref. Left Right Ref.  Tibial - [place]us 34.4 33.6 ?35.0 29.1 29.3 ?35.0         EMG full       EMG Summary Table    Spontaneous MUAP Recruitment  Muscle IA Fib PSW Fasc Other Amp Dur. Poly Pattern  R. Tibialis anterior Normal None None None _______ Normal Normal Normal Normal  R. Gastrocnemius (Medial head) Normal None None None _______ Normal Normal Normal Normal  R. Vastus lateralis Normal None None None _______ Normal Normal Normal Normal  L. Tibialis anterior Normal None None None _______ Normal Normal Normal Normal  L. Gastrocnemius (Medial head) Normal None None None _______ Normal Normal Normal Normal  L. Vastus lateralis Normal None None None _______ Normal Normal Normal Normal  L. Lumbar paraspinals (mid) Normal None None None _______ Normal Normal Normal Normal  L. Lumbar paraspinals (up) Normal None None None _______ Normal Normal Normal Normal  R. Lumbar paraspinals (mid) Normal None None None _______ Normal Normal Normal Normal  R. Lumbar paraspinals (low) Normal None None None _______ Normal Normal Normal Normal

## 2016-08-13 ENCOUNTER — Encounter: Payer: Medicaid Other | Admitting: Neurology

## 2016-08-31 ENCOUNTER — Ambulatory Visit (INDEPENDENT_AMBULATORY_CARE_PROVIDER_SITE_OTHER): Payer: Medicaid Other | Admitting: Neurology

## 2016-08-31 ENCOUNTER — Encounter: Payer: Self-pay | Admitting: Neurology

## 2016-08-31 VITALS — BP 158/99 | HR 73 | Ht 62.0 in | Wt 171.8 lb

## 2016-08-31 DIAGNOSIS — R202 Paresthesia of skin: Secondary | ICD-10-CM

## 2016-08-31 NOTE — Progress Notes (Signed)
Patient was in right lateral recombinant position. Sterile technique. 1% lidocaine with epinephrine was used for local anesthesia. Punctuated skin biopsy was performed. 3 mm skin sample were obtained at at left foot, above left extensor digitorum brevis, and left lateral calf, 10 cm above lateral malleolus, lateral thigh, 20 cm below superior iliac spine.  Patient tolerated the procedure well.  The wound was covered with neosporin antibiotic cream and bandage. 

## 2016-09-07 ENCOUNTER — Telehealth: Payer: Self-pay | Admitting: *Deleted

## 2016-09-07 NOTE — Telephone Encounter (Signed)
Skin Biopsy results:  Diagnosis: A) Lt Thigh, skin biopsy:  Skin with normal Epidermal Nerve Fiber Density B) Lt Calf, skin biopsy:  Skin with significantly reduced Epidermal Nerve Fiber Density, consistent with small fiber neuropathy. C) Lt Foot, skin biopsy: Skin with significantly reduced Epidermal Nerve Fiber Density, consistent with small fiber neuropathy.

## 2016-09-08 NOTE — Telephone Encounter (Addendum)
Patient aware of results.  She will continue her medications and keep her pending follow up appointment.

## 2016-09-08 NOTE — Telephone Encounter (Signed)
Please call patient for skin biopsy result

## 2016-10-12 DIAGNOSIS — F419 Anxiety disorder, unspecified: Secondary | ICD-10-CM | POA: Insufficient documentation

## 2016-10-20 DIAGNOSIS — G629 Polyneuropathy, unspecified: Secondary | ICD-10-CM | POA: Insufficient documentation

## 2016-11-02 ENCOUNTER — Ambulatory Visit: Payer: Medicaid Other | Admitting: Nurse Practitioner

## 2016-12-28 ENCOUNTER — Ambulatory Visit: Payer: Medicaid Other | Admitting: Nurse Practitioner

## 2017-01-10 DIAGNOSIS — G629 Polyneuropathy, unspecified: Secondary | ICD-10-CM | POA: Insufficient documentation

## 2017-04-17 ENCOUNTER — Emergency Department (HOSPITAL_BASED_OUTPATIENT_CLINIC_OR_DEPARTMENT_OTHER)
Admission: EM | Admit: 2017-04-17 | Discharge: 2017-04-17 | Disposition: A | Discharge status: Home / Self Care | Payer: Medicaid Other | Attending: Emergency Medicine | Admitting: Emergency Medicine

## 2017-04-17 ENCOUNTER — Encounter (HOSPITAL_BASED_OUTPATIENT_CLINIC_OR_DEPARTMENT_OTHER): Payer: Self-pay | Admitting: *Deleted

## 2017-04-17 ENCOUNTER — Other Ambulatory Visit: Payer: Self-pay

## 2017-04-17 DIAGNOSIS — M5442 Lumbago with sciatica, left side: Secondary | ICD-10-CM | POA: Diagnosis not present

## 2017-04-17 DIAGNOSIS — M5441 Lumbago with sciatica, right side: Secondary | ICD-10-CM | POA: Diagnosis not present

## 2017-04-17 DIAGNOSIS — Z79899 Other long term (current) drug therapy: Secondary | ICD-10-CM | POA: Diagnosis not present

## 2017-04-17 DIAGNOSIS — R202 Paresthesia of skin: Secondary | ICD-10-CM | POA: Diagnosis not present

## 2017-04-17 DIAGNOSIS — M549 Dorsalgia, unspecified: Secondary | ICD-10-CM | POA: Diagnosis present

## 2017-04-17 DIAGNOSIS — G8929 Other chronic pain: Secondary | ICD-10-CM

## 2017-04-17 DIAGNOSIS — I10 Essential (primary) hypertension: Secondary | ICD-10-CM | POA: Insufficient documentation

## 2017-04-17 MED ORDER — HYDROCODONE-ACETAMINOPHEN 5-325 MG PO TABS: 1.0000 | ORAL_TABLET | Freq: Four times a day (QID) | ORAL | 0 refills | Status: AC | PRN

## 2017-04-17 MED ORDER — PREDNISONE 20 MG PO TABS: ORAL_TABLET | ORAL | 0 refills | Status: DC

## 2017-04-17 MED ORDER — DEXAMETHASONE SODIUM PHOSPHATE 10 MG/ML IJ SOLN
4.0000 mg | Freq: Once | INTRAMUSCULAR | Status: AC
  Administered 2017-04-17: 4 mg via INTRAMUSCULAR
  Filled 2017-04-17: qty 1

## 2017-04-17 MED ORDER — HYDROMORPHONE HCL 1 MG/ML IJ SOLN
1.0000 mg | Freq: Once | INTRAMUSCULAR | Status: AC
  Administered 2017-04-17: 1 mg via INTRAMUSCULAR
  Filled 2017-04-17: qty 1

## 2017-04-17 NOTE — ED Notes (Signed)
ED Provider at bedside. 

## 2017-04-17 NOTE — ED Triage Notes (Signed)
Bilateral leg pain x 2 weeks. Has seen PCP without relief. Reports inability to sleep due to pain. Denies injury.

## 2017-04-17 NOTE — Discharge Instructions (Signed)
Take prednisone as prescribed.   Take tylenol, motrin for pain.   Continue gabapentin.   Take vicodin for severe pain.   See your neurologist and neurosurgeon as scheduled at the end of the month   Return to ER if you have worse paresthesias, numbness, weakness, unable to walk, fever
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
## 2017-05-17 ENCOUNTER — Other Ambulatory Visit: Payer: Self-pay | Admitting: Neurology

## 2017-06-04 ENCOUNTER — Other Ambulatory Visit: Payer: Self-pay

## 2017-06-04 ENCOUNTER — Emergency Department (HOSPITAL_BASED_OUTPATIENT_CLINIC_OR_DEPARTMENT_OTHER)
Admission: EM | Admit: 2017-06-04 | Discharge: 2017-06-04 | Disposition: A | Discharge status: Home / Self Care | Payer: Medicaid Other | Attending: Emergency Medicine | Admitting: Emergency Medicine

## 2017-06-04 ENCOUNTER — Encounter (HOSPITAL_BASED_OUTPATIENT_CLINIC_OR_DEPARTMENT_OTHER): Payer: Self-pay | Admitting: Emergency Medicine

## 2017-06-04 DIAGNOSIS — Z79899 Other long term (current) drug therapy: Secondary | ICD-10-CM | POA: Diagnosis not present

## 2017-06-04 DIAGNOSIS — I10 Essential (primary) hypertension: Secondary | ICD-10-CM | POA: Insufficient documentation

## 2017-06-04 DIAGNOSIS — Z91040 Latex allergy status: Secondary | ICD-10-CM | POA: Insufficient documentation

## 2017-06-04 DIAGNOSIS — R079 Chest pain, unspecified: Secondary | ICD-10-CM | POA: Diagnosis not present

## 2017-06-04 DIAGNOSIS — R1032 Left lower quadrant pain: Secondary | ICD-10-CM | POA: Diagnosis present

## 2017-06-04 HISTORY — DX: Diverticulitis of intestine, part unspecified, without perforation or abscess without bleeding: K57.92

## 2017-06-04 LAB — URINALYSIS, ROUTINE W REFLEX MICROSCOPIC
BILIRUBIN URINE: NEGATIVE
Glucose, UA: NEGATIVE mg/dL
KETONES UR: NEGATIVE mg/dL
LEUKOCYTES UA: NEGATIVE
Nitrite: NEGATIVE
PH: 7 (ref 5.0–8.0)
Protein, ur: NEGATIVE mg/dL
Specific Gravity, Urine: 1.01 (ref 1.005–1.030)

## 2017-06-04 LAB — COMPREHENSIVE METABOLIC PANEL
ALBUMIN: 3.6 g/dL (ref 3.5–5.0)
ALT: 16 U/L (ref 14–54)
ANION GAP: 12 (ref 5–15)
AST: 25 U/L (ref 15–41)
Alkaline Phosphatase: 65 U/L (ref 38–126)
BILIRUBIN TOTAL: 0.5 mg/dL (ref 0.3–1.2)
BUN: 7 mg/dL (ref 6–20)
CALCIUM: 8.8 mg/dL — AB (ref 8.9–10.3)
CO2: 24 mmol/L (ref 22–32)
Chloride: 102 mmol/L (ref 101–111)
Creatinine, Ser: 0.58 mg/dL (ref 0.44–1.00)
GFR calc non Af Amer: >60 mL/min (ref >60)
GLUCOSE: 97 mg/dL (ref 65–99)
POTASSIUM: 3.3 mmol/L — AB (ref 3.5–5.1)
SODIUM: 138 mmol/L (ref 135–145)
TOTAL PROTEIN: 7.1 g/dL (ref 6.5–8.1)

## 2017-06-04 LAB — CBC WITH DIFFERENTIAL/PLATELET
BASOS PCT: 0 %
Basophils Absolute: 0 10*3/uL (ref 0.0–0.1)
EOS ABS: 0.2 10*3/uL (ref 0.0–0.7)
Eosinophils Relative: 3 %
HCT: 39.2 % (ref 36.0–46.0)
Hemoglobin: 13.4 g/dL (ref 12.0–15.0)
LYMPHS ABS: 2 10*3/uL (ref 0.7–4.0)
Lymphocytes Relative: 31 %
MCH: 30.5 pg (ref 26.0–34.0)
MCHC: 34.2 g/dL (ref 30.0–36.0)
MCV: 89.1 fL (ref 78.0–100.0)
MONO ABS: 0.6 10*3/uL (ref 0.1–1.0)
MONOS PCT: 9 %
Neutro Abs: 3.6 10*3/uL (ref 1.7–7.7)
Neutrophils Relative %: 57 %
Platelets: 315 10*3/uL (ref 150–400)
RBC: 4.4 MIL/uL (ref 3.87–5.11)
RDW: 13.5 % (ref 11.5–15.5)
WBC: 6.3 10*3/uL (ref 4.0–10.5)

## 2017-06-04 LAB — URINALYSIS, MICROSCOPIC (REFLEX)

## 2017-06-04 LAB — LIPASE, BLOOD: Lipase: 42 U/L (ref 11–51)

## 2017-06-04 MED ORDER — SODIUM CHLORIDE 0.9 % IV BOLUS (SEPSIS)
1000.0000 mL | Freq: Once | INTRAVENOUS | Status: AC
  Administered 2017-06-04: 1000 mL via INTRAVENOUS

## 2017-06-04 MED ORDER — KETOROLAC TROMETHAMINE 15 MG/ML IJ SOLN
15.0000 mg | Freq: Once | INTRAMUSCULAR | Status: AC
  Administered 2017-06-04: 15 mg via INTRAVENOUS
  Filled 2017-06-04: qty 1

## 2017-06-04 NOTE — ED Triage Notes (Signed)
LLQ abd pain with diarrhea x 2 days.

## 2017-06-04 NOTE — ED Provider Notes (Signed)
[facility] EMERGENCY DEPARTMENT Provider Note  CSN: [id number] Arrival date & time: 06/04/17 [id number]  Chief Complaint(s) Abdominal Pain  HPI Brooke BouchardVictoria Cruz is a 47 y.o. female with a history of hypertension, hyperlipidemia, chronic back pain, reported prior history of diverticulitis who presents to the emergency department with 8-12 hours of left lower quadrant abdominal pain described as constant achiness with intermittent sharpness" pinching."  Pain is exacerbated with ambulation and improved by "curling up.".  Patient has not taken any medication for the pain.  States that it feels somewhat similar to prior diverticulitis pain but she does not have her usual fever, nausea, and vomiting.  The patient endorses stable bowel movements without any recent change.  No hematochezia or melena.  She denies any urinary symptoms.  She is currently on her menstrual cycle which started 2 days ago.  Patient has a female partner and denies any intercourse with males.  No vaginal discharge.   Patient reports that she was unable to sleep due to her pain last night.  Additionally patient reported 3 hours of constant bilateral chest pain that has now resolved.  States that this feels similar to her prior anxiety.  Pain was nonradiating, nonexertional with no associated shortness of breath, diaphoresis, nausea, vomiting.  She denies any recent fevers or infections.  No respiratory symptoms.  Patient denies any other acute physical complaints.  HPI  Past Medical History Past Medical History:  Diagnosis Date  . Anxiety and depression   . Chronic back pain   . Depression   . Diverticulitis   . Hyperlipemia   . Hypertension   . Peripheral neuropathy   . PTSD (post-traumatic stress disorder)    Patient Active Problem List   Diagnosis Date Noted  . Chronic left-sided low back pain 04/14/2016  . Paresthesia 02/09/2016  . Blurred vision 02/09/2016  . Neck pain on right side 02/09/2016   Home  Medication(s) Prior to Admission medications   Medication Sig Start Date End Date Taking? Authorizing Provider  amLODipine (NORVASC) 5 MG tablet daily. 12/17/15   [provider]  busPIRone (BUSPAR) 10 MG tablet Take 10 mg by mouth 2 (two) times daily.    [provider]  cetirizine (ZYRTEC) 10 MG tablet Take by mouth daily.    [provider]  citalopram (CELEXA) 40 MG tablet daily. 07/29/15   [provider]  Cyanocobalamin (VITAMIN B-12 PO) Take by mouth daily.    [provider]  cyclobenzaprine (FLEXERIL) 10 MG tablet Take 1 tablet (10 mg total) by mouth 3 (three) times daily as needed. 04/14/16   Levert FeinsteinYan, Yijun, MD  DULoxetine (CYMBALTA) 60 MG capsule Take 1 capsule (60 mg total) by mouth daily. 07/30/16   Levert FeinsteinYan, Yijun, MD  EPINEPHrine (EPIPEN) 0.3 mg/0.3 mL DEVI Inject 0.3 mLs (0.3 mg total) into the muscle as needed. 04/04/12   Zadie RhineWickline, Donald, MD  gabapentin (NEURONTIN) 300 MG capsule Take 2 capsules (600 mg total) by mouth 2 (two) times daily. 07/30/16   Levert FeinsteinYan, Yijun, MD  HYDROcodone-acetaminophen (NORCO/VICODIN) 5-325 MG tablet Take 1 tablet by mouth every 6 (six) hours as needed. 04/17/17   Charlynne PanderYao, David Hsienta, MD  ibuprofen (ADVIL,MOTRIN) 800 MG tablet Take 1 tablet (800 mg total) by mouth every 8 (eight) hours as needed. 04/22/15   Lawyer, Cristal Deerhristopher, PA-C  lidocaine (XYLOCAINE) 2 % jelly Apply 1 application topically as needed. As needed for bilateral feet paresthesia 04/14/16   Levert FeinsteinYan, Yijun, MD  ondansetron (ZOFRAN) 4 MG tablet Take 1 tablet (  4 mg total) by mouth every 6 (six) hours. 08/27/14   Hess, Nada Boozer, PA-C  predniSONE (DELTASONE) 20 MG tablet Take 60 mg daily x 2 days then 40 mg daily x 2 days then 20 mg daily x 2 days 04/17/17   Charlynne Pander, MD  traZODone (DESYREL) 50 MG tablet Take 50 mg by mouth at bedtime.    [provider]                                                                                                                                     Past Surgical History Past Surgical History:  Procedure Laterality Date  . CESAREAN SECTION     x 2  . NASAL SINUS SURGERY     Family History Family History  Problem Relation Age of Onset  . Hypertension Mother   . Fibromyalgia Mother   . Schizophrenia Father   . Schizophrenia Sister   . Colon cancer Maternal Grandmother   . Heart disease Maternal Grandfather   . Breast cancer Maternal Aunt     Social History Social History   Tobacco Use  . Smoking status: Never Smoker  . Smokeless tobacco: Never Used  Substance Use Topics  . Alcohol use: No  . Drug use: No   Allergies Eggs or egg-derived products; Latex; Onion; Other; Sulfa antibiotics; and Corn oil  Review of Systems Review of Systems All other systems are reviewed and are negative for acute change except as noted in the HPI  Physical Exam Vital Signs  I have reviewed the triage vital signs BP (!) 152/92   Pulse 84   Temp 98.8 F (37.1 C) (Oral)   Resp 16   Ht 5\' 2"  (1.575 m)   Wt 77.6 kg (171 lb)   LMP 06/02/2017   SpO2 96%   BMI 31.28 kg/m   Physical Exam  Constitutional: She is oriented to person, place, and time. She appears well-developed and well-nourished. No distress.  HENT:  Head: Normocephalic and atraumatic.  Nose: Nose normal.  Eyes: Conjunctivae and EOM are normal. Pupils are equal, round, and reactive to light. Right eye exhibits no discharge. Left eye exhibits no discharge. No scleral icterus.  Neck: Normal range of motion. Neck supple.  Cardiovascular: Normal rate and regular rhythm. Exam reveals no gallop and no friction rub.  No murmur heard. Pulmonary/Chest: Effort normal and breath sounds normal. No stridor. No respiratory distress. She has no rales.  Abdominal: Soft. She exhibits no distension. There is tenderness in the left lower quadrant. There is no rigidity, no rebound, no guarding and no CVA tenderness. No hernia.  Musculoskeletal: She exhibits no  edema or tenderness.  Neurological: She is alert and oriented to person, place, and time.  Skin: Skin is warm and dry. No rash noted. She is not diaphoretic. No erythema.  Psychiatric: She has a normal mood and affect.  Vitals reviewed.   ED  Results and Treatments Labs (all labs ordered are listed, but only abnormal results are displayed) Labs Reviewed  COMPREHENSIVE METABOLIC PANEL - Abnormal; Notable for the following components:      Result Value   Potassium 3.3 (*)    Calcium 8.8 (*)    All other components within normal limits  URINALYSIS, ROUTINE W REFLEX MICROSCOPIC - Abnormal; Notable for the following components:   Color, Urine STRAW (*)    Hgb urine dipstick LARGE (*)    All other components within normal limits  URINALYSIS, MICROSCOPIC (REFLEX) - Abnormal; Notable for the following components:   Bacteria, UA FEW (*)    Squamous Epithelial / LPF 0-5 (*)    All other components within normal limits  CBC WITH DIFFERENTIAL/PLATELET  LIPASE, BLOOD                                                                                                                         EKG  EKG Interpretation  Date/Time:  Saturday June 04 2017 08:06:06 EST Ventricular Rate:  82 PR Interval:    QRS Duration: 95 QT Interval:  370 QTC Calculation: 433 R Axis:   50 Text Interpretation:  Sinus rhythm No significant change since last tracing Confirmed by Drema Pry ([id number]) on 06/04/2017 8:23:01 AM      Radiology No results found. Pertinent labs & imaging results that were available during my care of the patient were reviewed by me and considered in my medical decision making (see chart for details).  Medications Ordered in ED Medications  sodium chloride 0.9 % bolus 1,000 mL (0 mLs Intravenous Stopped 06/04/17 0918)  ketorolac (TORADOL) 15 MG/ML injection 15 mg (15 mg Intravenous Given 06/04/17 6045)                                                                                                                                     Procedures Procedures  (including critical care time)  Medical Decision Making / ED Course I have reviewed the nursing notes for this encounter and the patient's prior records (if available in EHR or on provided paperwork).    1. Left lower quadrant abdominal pain.  Differential diagnosis includes diverticulitis, urinary tract infection, menstrual pain.  Patient denies any vaginal discharge, low suspicion for PID.  Low suspicion for ovarian torsion.    Will obtain screening labs and urine.  Screening labs grossly reassuring without  evidence of leukocytosis, significant electrolyte derangements, renal insufficiency, biliary obstruction or evidence of pancreatitis.  Low suspicion for any serious intra-abdominal inflammatory/infectious process such as atypical presentation for appendicitis, small bowel obstruction, etc.  UA without evidence of infection.  Patient provided with IV Toradol resulting in moderate improvement in her pain.  Patient is able to tolerate oral hydration.   2.  Chest pain Currently asymptomatic.  Atypical in nature.  Reports that this is similar to previous episodes of anxiety, which may be playing a role in her chest pain given her left lower quadrant pain and inability to sleep due to it.  EKG nonischemic and without any acute changes..  Lungs clear to auscultation bilaterally.  No infectious symptoms.  Given the fact that she is asymptomatic at this time, I do not feel that chest x-ray is warranted at this time.  I have low suspicion for ACS, pulmonary embolism, esophageal perforation.  Presentation is not classic for aortic dissection.    The patient appears reasonably screened and/or stabilized for discharge and I doubt any other medical condition or other [facility] requiring further screening, evaluation, or treatment in the ED at this time prior to discharge.\  The patient is safe for discharge with strict return  precautions.  Final Clinical Impression(s) / ED Diagnoses Final diagnoses:  LLQ pain    Disposition: Discharge  Condition: Good  I have discussed the results, Dx and Tx plan with the patient who expressed understanding and agree(s) with the plan. Discharge instructions discussed at great length. The patient was given strict return precautions who verbalized understanding of the instructions. No further questions at time of discharge.    ED Discharge Orders    None       Follow Up: Dois Davenport, MD [address] [place] [place] [address] [phone]  Schedule an appointment as soon as possible for a visit  As needed     This chart was dictated using voice recognition software.  Despite best efforts to proofread,  errors can occur which can change the documentation meaning.   Nira Conn, MD 06/04/17 [phone]

## 2017-06-04 NOTE — Discharge Instructions (Signed)

## 2017-06-04 NOTE — ED Notes (Signed)
ED Provider at bedside. EDP denied need for EKG at this time.

## 2017-10-28 ENCOUNTER — Other Ambulatory Visit: Payer: Self-pay | Admitting: Neurology

## 2018-04-04 ENCOUNTER — Encounter: Payer: Self-pay | Admitting: Plastic Surgery

## 2018-04-04 ENCOUNTER — Ambulatory Visit (INDEPENDENT_AMBULATORY_CARE_PROVIDER_SITE_OTHER): Payer: Medicare Other | Admitting: Plastic Surgery

## 2018-04-04 DIAGNOSIS — M546 Pain in thoracic spine: Secondary | ICD-10-CM

## 2018-04-04 DIAGNOSIS — M542 Cervicalgia: Secondary | ICD-10-CM | POA: Diagnosis not present

## 2018-04-04 DIAGNOSIS — G8929 Other chronic pain: Secondary | ICD-10-CM

## 2018-04-04 DIAGNOSIS — M549 Dorsalgia, unspecified: Secondary | ICD-10-CM | POA: Insufficient documentation

## 2018-04-04 DIAGNOSIS — N62 Hypertrophy of breast: Secondary | ICD-10-CM | POA: Diagnosis not present

## 2018-04-04 DIAGNOSIS — G43909 Migraine, unspecified, not intractable, without status migrainosus: Secondary | ICD-10-CM | POA: Insufficient documentation

## 2018-04-04 NOTE — Progress Notes (Signed)
Patient ID: Brooke BouchardVictoria Senft, female    DOB: 1971/02/03, 47 y.o.   MRN: [id number]   Chief Complaint  Patient presents with  . Breast Problem    Mammary Hyperplasia: The patient is a 47 y.o. female with a history of mammary hyperplasia for several years.  She has extremely large breasts causing symptoms that include the following: Back pain (upper and lower) and neck pain. She frequently pins bra cups higher on straps for better lift and relief. Notices relief when holding breast up in her hands. Shoulder straps causing grooves, pain occasionally requiring padding. Pain medication is sometimes required with motrin and tylenol.  Activities that are hindered by enlarged breasts include: Running, exercise and weight lifting.  Her breasts are extremely large and fairly symmetric.  She has hyperpigmentation of the inframammary area on both sides.  The sternal to nipple distance on the right is 33 cm and the left is 33 cm.  The IMF distance is 14 cm.  She is 5 feet 2 inches tall and weighs 163 pounds.  Preoperative bra size = 38 DDD but should be 36 cup.  The estimated excess breast tissue to be removed at the time of surgery = 700 grams on the left and 700 grams on the right.  Mammogram history: She has not had a mammogram in the past year have requested that she get one prior to submitting for preauthorization.  She has had multiple episodes of chronic back pain and neck pain for which she has seen neurosurgery, pain management, PCP, PT in the past, and ER visits.    Review of Systems  Constitutional: Negative.   HENT: Negative.   Eyes: Negative.   Respiratory: Negative.   Cardiovascular: Negative.   Gastrointestinal: Negative.  Negative for abdominal distention.  Endocrine: Negative.   Genitourinary: Negative.   Musculoskeletal: Positive for back pain and neck pain.  Skin: Negative.   Psychiatric/Behavioral: Positive for sleep disturbance.    Past Medical History:  Diagnosis Date    . Anxiety and depression   . Chronic back pain   . Depression   . Diverticulitis   . Hyperlipemia   . Hypertension   . Peripheral neuropathy   . PTSD (post-traumatic stress disorder)     Past Surgical History:  Procedure Laterality Date  . CESAREAN SECTION     x 2  . NASAL SINUS SURGERY        Current Outpatient Medications:  .  amLODIPine Besylate (NORVASC PO), Take 10 mg by mouth daily., Disp: , Rfl:  .  busPIRone (BUSPAR) 10 MG tablet, Take 10 mg by mouth 2 (two) times daily., Disp: , Rfl:  .  cetirizine (ZYRTEC) 10 MG tablet, Take by mouth daily., Disp: , Rfl:  .  citalopram (CELEXA) 40 MG tablet, daily., Disp: , Rfl:  .  Cyanocobalamin (VITAMIN B-12 PO), Take by mouth daily., Disp: , Rfl:  .  cyclobenzaprine (FLEXERIL) 10 MG tablet, Take 1 tablet (10 mg total) by mouth 3 (three) times daily as needed., Disp: 90 tablet, Rfl: 6 .  DULoxetine (CYMBALTA) 60 MG capsule, Take 1 capsule (60 mg total) by mouth daily., Disp: 90 capsule, Rfl: 4 .  EPINEPHrine (EPIPEN) 0.3 mg/0.3 mL DEVI, Inject 0.3 mLs (0.3 mg total) into the muscle as needed., Disp: 2 Device, Rfl: 1 .  gabapentin (NEURONTIN) 300 MG capsule, Take 2 capsules (600 mg total) by mouth 2 (two) times daily., Disp: 360 capsule, Rfl: 4 .  HYDROcodone-acetaminophen (NORCO/VICODIN) 5-325 MG tablet,  Take 1 tablet by mouth every 6 (six) hours as needed., Disp: 10 tablet, Rfl: 0 .  ibuprofen (ADVIL,MOTRIN) 800 MG tablet, Take 1 tablet (800 mg total) by mouth every 8 (eight) hours as needed., Disp: 21 tablet, Rfl: 0 .  lidocaine (XYLOCAINE) 2 % jelly, Apply 1 application topically as needed. As needed for bilateral feet paresthesia, Disp: 30 mL, Rfl: 11 .  ondansetron (ZOFRAN) 4 MG tablet, Take 1 tablet (4 mg total) by mouth every 6 (six) hours., Disp: 12 tablet, Rfl: 0 .  predniSONE (DELTASONE) 20 MG tablet, Take 60 mg daily x 2 days then 40 mg daily x 2 days then 20 mg daily x 2 days, Disp: 12 tablet, Rfl: 0 .  traZODone (DESYREL)  50 MG tablet, Take 50 mg by mouth at bedtime., Disp: , Rfl:    Objective:   Vitals:   04/04/18 0916  BP: (!) 142/84  Pulse: 80  Temp: 98.5 F (36.9 C)  SpO2: 99%    Physical Exam Vitals signs and nursing note reviewed.  Constitutional:      Appearance: Normal appearance.  HENT:     Head: Normocephalic.  Eyes:     Extraocular Movements: Extraocular movements intact.     Pupils: Pupils are equal, round, and reactive to light.  Cardiovascular:     Rate and Rhythm: Normal rate.  Pulmonary:     Effort: Pulmonary effort is normal.  Abdominal:     General: Abdomen is flat. There is no distension.     Tenderness: There is no abdominal tenderness.  Skin:    General: Skin is warm.  Neurological:     Mental Status: She is alert.  Psychiatric:        Mood and Affect: Mood normal.        Thought Content: Thought content normal.        Judgment: Judgment normal.     Assessment & Plan:  Chronic bilateral thoracic back pain  Neck pain  Symptomatic mammary hypertrophy We discussed the options for breast reduction which I think she would benefit from.  She most likely could have a superior medial pedicle technique.  She will need an updated mammogram and can do that either directly or through her PCP.  I asked that she have that done prior to submitting for preauthorization.  We will also need her notes from her PCP.  She is also been referred for PT which I have encouraged her to do.  We also discussed improving her protein intake, taking a multivitamin and vitamin C.  Alena Billslaire S , DO

## 2018-05-13 ENCOUNTER — Other Ambulatory Visit: Payer: Self-pay | Admitting: Nurse Practitioner

## 2018-05-13 DIAGNOSIS — Z1231 Encounter for screening mammogram for malignant neoplasm of breast: Secondary | ICD-10-CM

## 2018-06-01 ENCOUNTER — Ambulatory Visit
Admission: RE | Admit: 2018-06-01 | Discharge: 2018-06-01 | Disposition: A | Discharge status: Home / Self Care | Payer: Medicare Other | Source: Ambulatory Visit | Attending: Nurse Practitioner | Admitting: Nurse Practitioner

## 2018-06-01 DIAGNOSIS — Z1231 Encounter for screening mammogram for malignant neoplasm of breast: Secondary | ICD-10-CM

## 2018-06-05 ENCOUNTER — Other Ambulatory Visit: Payer: Self-pay | Admitting: Nurse Practitioner

## 2018-06-05 DIAGNOSIS — R928 Other abnormal and inconclusive findings on diagnostic imaging of breast: Secondary | ICD-10-CM

## 2018-06-09 ENCOUNTER — Ambulatory Visit
Admission: RE | Admit: 2018-06-09 | Discharge: 2018-06-09 | Disposition: A | Discharge status: Home / Self Care | Payer: Medicare Other | Source: Ambulatory Visit | Attending: Nurse Practitioner | Admitting: Nurse Practitioner

## 2018-06-09 ENCOUNTER — Other Ambulatory Visit: Payer: Self-pay | Admitting: Nurse Practitioner

## 2018-06-09 DIAGNOSIS — N6320 Unspecified lump in the left breast, unspecified quadrant: Secondary | ICD-10-CM

## 2018-06-09 DIAGNOSIS — R928 Other abnormal and inconclusive findings on diagnostic imaging of breast: Secondary | ICD-10-CM

## 2018-06-14 ENCOUNTER — Other Ambulatory Visit: Payer: Medicare Other

## 2018-06-19 ENCOUNTER — Other Ambulatory Visit: Payer: Medicare Other

## 2018-06-20 ENCOUNTER — Other Ambulatory Visit: Payer: Self-pay

## 2018-06-20 ENCOUNTER — Ambulatory Visit
Admission: RE | Admit: 2018-06-20 | Discharge: 2018-06-20 | Disposition: A | Discharge status: Home / Self Care | Payer: Medicare Other | Source: Ambulatory Visit | Attending: Nurse Practitioner | Admitting: Nurse Practitioner

## 2018-06-20 DIAGNOSIS — N6320 Unspecified lump in the left breast, unspecified quadrant: Secondary | ICD-10-CM

## 2019-01-07 ENCOUNTER — Emergency Department (HOSPITAL_BASED_OUTPATIENT_CLINIC_OR_DEPARTMENT_OTHER)
Admission: EM | Admit: 2019-01-07 | Discharge: 2019-01-07 | Disposition: A | Discharge status: Home / Self Care | Payer: Medicare Other | Attending: Emergency Medicine | Admitting: Emergency Medicine

## 2019-01-07 ENCOUNTER — Encounter (HOSPITAL_BASED_OUTPATIENT_CLINIC_OR_DEPARTMENT_OTHER): Payer: Self-pay | Admitting: Emergency Medicine

## 2019-01-07 ENCOUNTER — Emergency Department (HOSPITAL_BASED_OUTPATIENT_CLINIC_OR_DEPARTMENT_OTHER): Payer: Medicare Other

## 2019-01-07 ENCOUNTER — Other Ambulatory Visit: Payer: Self-pay

## 2019-01-07 DIAGNOSIS — Z79899 Other long term (current) drug therapy: Secondary | ICD-10-CM | POA: Diagnosis not present

## 2019-01-07 DIAGNOSIS — E876 Hypokalemia: Secondary | ICD-10-CM | POA: Diagnosis not present

## 2019-01-07 DIAGNOSIS — Z91040 Latex allergy status: Secondary | ICD-10-CM | POA: Insufficient documentation

## 2019-01-07 DIAGNOSIS — I10 Essential (primary) hypertension: Secondary | ICD-10-CM | POA: Insufficient documentation

## 2019-01-07 DIAGNOSIS — R079 Chest pain, unspecified: Secondary | ICD-10-CM | POA: Diagnosis present

## 2019-01-07 LAB — BASIC METABOLIC PANEL
Anion gap: 14 (ref 5–15)
BUN: 13 mg/dL (ref 6–20)
CO2: 27 mmol/L (ref 22–32)
Calcium: 8.9 mg/dL (ref 8.9–10.3)
Chloride: 95 mmol/L — ABNORMAL LOW (ref 98–111)
Creatinine, Ser: 0.57 mg/dL (ref 0.44–1.00)
GFR calc Af Amer: >60 mL/min (ref >60)
GFR calc non Af Amer: >60 mL/min (ref >60)
Glucose, Bld: 161 mg/dL — ABNORMAL HIGH (ref 70–99)
Potassium: 2.5 mmol/L — CL (ref 3.5–5.1)
Sodium: 136 mmol/L (ref 135–145)

## 2019-01-07 LAB — CBC
HCT: 41.9 % (ref 36.0–46.0)
Hemoglobin: 14.5 g/dL (ref 12.0–15.0)
MCH: 29.2 pg (ref 26.0–34.0)
MCHC: 34.6 g/dL (ref 30.0–36.0)
MCV: 84.5 fL (ref 80.0–100.0)
Platelets: 415 10*3/uL — ABNORMAL HIGH (ref 150–400)
RBC: 4.96 MIL/uL (ref 3.87–5.11)
RDW: 12.7 % (ref 11.5–15.5)
WBC: 6.8 10*3/uL (ref 4.0–10.5)
nRBC: 0 % (ref 0.0–0.2)

## 2019-01-07 LAB — URINALYSIS, ROUTINE W REFLEX MICROSCOPIC
Bilirubin Urine: NEGATIVE
Glucose, UA: NEGATIVE mg/dL
Ketones, ur: NEGATIVE mg/dL
Leukocytes,Ua: NEGATIVE
Nitrite: NEGATIVE
Protein, ur: NEGATIVE mg/dL
Specific Gravity, Urine: <1.005 — ABNORMAL LOW (ref 1.005–1.030)
pH: 7 (ref 5.0–8.0)

## 2019-01-07 LAB — URINALYSIS, MICROSCOPIC (REFLEX)

## 2019-01-07 LAB — TROPONIN I (HIGH SENSITIVITY)
Troponin I (High Sensitivity): 3 ng/L (ref <18)
Troponin I (High Sensitivity): 2 ng/L (ref <18)

## 2019-01-07 LAB — PREGNANCY, URINE: Preg Test, Ur: NEGATIVE

## 2019-01-07 LAB — MAGNESIUM: Magnesium: 1.8 mg/dL (ref 1.7–2.4)

## 2019-01-07 MED ORDER — POTASSIUM CHLORIDE 10 MEQ/100ML IV SOLN
10.0000 meq | Freq: Once | INTRAVENOUS | Status: AC
  Administered 2019-01-07: 16:00:00 10 meq via INTRAVENOUS
  Filled 2019-01-07: qty 100

## 2019-01-07 MED ORDER — POTASSIUM CHLORIDE CRYS ER 20 MEQ PO TBCR
40.0000 meq | EXTENDED_RELEASE_TABLET | Freq: Once | ORAL | Status: AC
  Administered 2019-01-07: 16:00:00 40 meq via ORAL
  Filled 2019-01-07: qty 2

## 2019-01-07 MED ORDER — LORAZEPAM 2 MG/ML IJ SOLN
1.0000 mg | Freq: Once | INTRAMUSCULAR | Status: AC
  Administered 2019-01-07: 16:00:00 1 mg via INTRAVENOUS
  Filled 2019-01-07: qty 1

## 2019-01-07 MED ORDER — SODIUM CHLORIDE 0.9 % IV BOLUS
1000.0000 mL | Freq: Once | INTRAVENOUS | Status: AC
  Administered 2019-01-07: 16:00:00 1000 mL via INTRAVENOUS

## 2019-01-07 MED ORDER — POTASSIUM CHLORIDE CRYS ER 20 MEQ PO TBCR: 20.0000 meq | EXTENDED_RELEASE_TABLET | Freq: Every day | ORAL | 0 refills | Status: AC

## 2019-01-07 MED ORDER — POTASSIUM CHLORIDE 10 MEQ/100ML IV SOLN
10.0000 meq | Freq: Once | INTRAVENOUS | Status: AC
  Administered 2019-01-07: 18:00:00 10 meq via INTRAVENOUS
  Filled 2019-01-07: qty 100

## 2019-01-07 NOTE — ED Notes (Signed)
Beverage and crackers provided. 

## 2019-01-07 NOTE — ED Triage Notes (Signed)
Pt reports chest pressure, muscle cramps today. Thinks her potassium is low.

## 2019-01-07 NOTE — ED Notes (Signed)
ED Provider at bedside. 

## 2019-01-07 NOTE — Discharge Instructions (Addendum)
Hold lasix and HCTZ for 3 days.  You will need to follow up with your pcp in about 1 week to get your potassium rechecked.

## 2019-01-07 NOTE — ED Notes (Addendum)
K+ 2.5, results given to ED MD and RN

## 2019-01-07 NOTE — ED Provider Notes (Signed)
[facility] EMERGENCY DEPARTMENT Provider Note   CSN: [id number] Arrival date & time: 01/07/19  1404     History   Chief Complaint Chief Complaint  Patient presents with  . Chest Pain    HPI Brooke Cruz is a 48 y.o. female.     Pt presents to the ED today with chest pressure, numbness all over, and LUQ abdominal cramps.  She feels fatigued, but that is not unusual for pt.  Pt is worried her K may be low.       Past Medical History:  Diagnosis Date  . Anxiety and depression   . Chronic back pain   . Depression   . Diverticulitis   . Hyperlipemia   . Hypertension   . Peripheral neuropathy   . PTSD (post-traumatic stress disorder)     Patient Active Problem List   Diagnosis Date Noted  . Migraine 04/04/2018  . Back pain 04/04/2018  . Neck pain 04/04/2018  . Symptomatic mammary hypertrophy 04/04/2018  . Neuropathy 10/20/2016  . Anxiety 10/12/2016  . Chronic left-sided low back pain 04/14/2016  . Paresthesia 02/09/2016  . Blurred vision 02/09/2016  . Neck pain on right side 02/09/2016  . Major depressive disorder, recurrent episode, moderate (HCC) 05/27/2015  . Diverticulitis 12/27/2014  . Hyperlipidemia 07/26/2014  . Hypertension 05/31/2014  . Exercise-induced asthma 02/21/2014  . GERD (gastroesophageal reflux disease) 02/21/2014  . Depression 02/20/2014    Past Surgical History:  Procedure Laterality Date  . CESAREAN SECTION     x 2  . NASAL SINUS SURGERY       OB History    Gravida  6   Para  3   Term      Preterm      AB  2   Living        SAB  2   TAB      Ectopic      Multiple      Live Births               Home Medications    Prior to Admission medications   Medication Sig Start Date End Date Taking? Authorizing Provider  amLODIPine Besylate (NORVASC PO) Take 10 mg by mouth daily.   Yes [provider]  busPIRone (BUSPAR) 10 MG tablet Take 10 mg by mouth 2 (two) times daily.   Yes [provider]  cetirizine (ZYRTEC) 10 MG tablet Take by mouth daily.   Yes [provider]  citalopram (CELEXA) 40 MG tablet daily. 07/29/15  Yes [provider]  cyclobenzaprine (FLEXERIL) 10 MG tablet Take 1 tablet (10 mg total) by mouth 3 (three) times daily as needed. 04/14/16  Yes Levert FeinsteinYan, Yijun, MD  DULoxetine (CYMBALTA) 60 MG capsule Take 1 capsule (60 mg total) by mouth daily. 07/30/16  Yes Levert FeinsteinYan, Yijun, MD  furosemide (LASIX) 40 MG tablet Take 40 mg by mouth daily as needed.   Yes [provider]  gabapentin (NEURONTIN) 300 MG capsule Take 2 capsules (600 mg total) by mouth 2 (two) times daily. 07/30/16  Yes Levert FeinsteinYan, Yijun, MD  hydrochlorothiazide (HYDRODIURIL) 25 MG tablet Take 25 mg by mouth 2 (two) times daily.   Yes [provider]  HYDROcodone-acetaminophen (NORCO/VICODIN) 5-325 MG tablet Take 1 tablet by mouth every 6 (six) hours as needed. 04/17/17  Yes Charlynne PanderYao, David Hsienta, MD  ibuprofen (ADVIL,MOTRIN) 800 MG tablet Take 1 tablet (800 mg total) by mouth every 8 (eight) hours as needed. 04/22/15  Yes  Lawyer, Cristal Deer, PA-C  predniSONE (DELTASONE) 20 MG tablet Take 60 mg daily x 2 days then 40 mg daily x 2 days then 20 mg daily x 2 days 04/17/17  Yes Charlynne Pander, MD  Cyanocobalamin (VITAMIN B-12 PO) Take by mouth daily.    [provider]  EPINEPHrine (EPIPEN) 0.3 mg/0.3 mL DEVI Inject 0.3 mLs (0.3 mg total) into the muscle as needed. 04/04/12   Zadie Rhine, MD  lidocaine (XYLOCAINE) 2 % jelly Apply 1 application topically as needed. As needed for bilateral feet paresthesia 04/14/16   Levert Feinstein, MD  ondansetron (ZOFRAN) 4 MG tablet Take 1 tablet (4 mg total) by mouth every 6 (six) hours. 08/27/14   Hess, Nada Boozer, PA-C  potassium chloride SA (KLOR-CON) 20 MEQ tablet Take 1 tablet (20 mEq total) by mouth daily. 01/07/19   Jacalyn Lefevre, MD  traZODone (DESYREL) 50 MG tablet Take 50 mg by mouth at bedtime.    [provider]    Family  History Family History  Problem Relation Age of Onset  . Hypertension Mother   . Fibromyalgia Mother   . Schizophrenia Father   . Schizophrenia Sister   . Colon cancer Maternal Grandmother   . Heart disease Maternal Grandfather   . Breast cancer Maternal Aunt     Social History Social History   Tobacco Use  . Smoking status: Never Smoker  . Smokeless tobacco: Never Used  Substance Use Topics  . Alcohol use: No  . Drug use: No     Allergies   Influenza vaccines, Eggs or egg-derived products, Latex, Onion, Other, Sulfa antibiotics, and Corn oil   Review of Systems Review of Systems  Constitutional: Positive for fatigue.  Cardiovascular: Positive for chest pain.  Gastrointestinal: Positive for abdominal pain.  Neurological: Positive for weakness and numbness.  All other systems reviewed and are negative.    Physical Exam Updated Vital Signs BP 137/84 (BP Location: Right Arm)   Pulse 98   Temp 99.3 F (37.4 C) (Oral)   Resp 17   Ht 5\' 2"  (1.575 m)   Wt 74.8 kg   LMP 12/26/2018   SpO2 99%   BMI 30.18 kg/m   Physical Exam Vitals signs and nursing note reviewed.  Constitutional:      Appearance: She is well-developed.  HENT:     Head: Normocephalic and atraumatic.  Eyes:     Extraocular Movements: Extraocular movements intact.     Pupils: Pupils are equal, round, and reactive to light.  Neck:     Musculoskeletal: Normal range of motion and neck supple.  Cardiovascular:     Rate and Rhythm: Normal rate and regular rhythm.     Heart sounds: Normal heart sounds.  Pulmonary:     Effort: Pulmonary effort is normal.     Breath sounds: Normal breath sounds.  Abdominal:     General: Bowel sounds are normal.     Palpations: Abdomen is soft.  Musculoskeletal: Normal range of motion.  Skin:    General: Skin is warm.     Capillary Refill: Capillary refill takes less than 2 seconds.  Neurological:     General: No focal deficit present.     Mental Status: She  is alert and oriented to person, place, and time.  Psychiatric:        Mood and Affect: Mood is anxious.      ED Treatments / Results  Labs (all labs ordered are listed, but only abnormal results are displayed) Labs  Reviewed  BASIC METABOLIC PANEL - Abnormal; Notable for the following components:      Result Value   Potassium 2.5 (*)    Chloride 95 (*)    Glucose, Bld 161 (*)    All other components within normal limits  CBC - Abnormal; Notable for the following components:   Platelets 415 (*)    All other components within normal limits  URINALYSIS, ROUTINE W REFLEX MICROSCOPIC - Abnormal; Notable for the following components:   Specific Gravity, Urine <1.005 (*)    Hgb urine dipstick TRACE (*)    All other components within normal limits  URINALYSIS, MICROSCOPIC (REFLEX) - Abnormal; Notable for the following components:   Bacteria, UA FEW (*)    All other components within normal limits  PREGNANCY, URINE  MAGNESIUM  TROPONIN I (HIGH SENSITIVITY)  TROPONIN I (HIGH SENSITIVITY)    EKG EKG Interpretation  Date/Time:  Sunday January 07 2019 14:12:11 EDT Ventricular Rate:  103 PR Interval:  138 QRS Duration: 88 QT Interval:  388 QTC Calculation: 508 R Axis:   52 Text Interpretation:  Sinus tachycardia Otherwise normal ECG Confirmed by Rees, Elizabeth ([id number]) on 01/07/2019 2:50:25 PM   Radiology Dg Chest 2 View  Result Date: 01/07/2019 CLINICAL DATA:  47 year old female with history of chest pain. EXAM: CHEST - 2 VIEW COMPARISON:  Chest x-ray 04/27/2016. FINDINGS: Lung volumes are normal. No consolidative airspace disease. No pleural effusions. No pneumothorax. No pulmonary nodule or mass noted. Pulmonary vasculature and the cardiomediastinal silhouette are within normal limits. IMPRESSION: No radiographic evidence of acute cardiopulmonary disease. Electronically Signed   By: Daniel  Entrikin M.D.   On: 01/07/2019 15:12    Procedures Procedures (including critical care  time)  Medications Ordered in ED Medications  potassium chloride 10 mEq in 100 mL IVPB (10 mEq Intravenous New Bag/Given 01/07/19 1736)  sodium chloride 0.9 % bolus 1,000 mL (0 mLs Intravenous Stopped 01/07/19 1732)  LORazepam (ATIVAN) injection 1 mg (1 mg Intravenous Given 01/07/19 1533)  potassium chloride SA (KLOR-CON) CR tablet 40 mEq (40 mEq Oral Given 01/07/19 1622)  potassium chloride 10 mEq in 100 mL IVPB (0 mEq Intravenous Stopped 01/07/19 1733)     Initial Impression / Assessment and Plan / ED Course  I have reviewed the triage vital signs and the nursing notes.  Pertinent labs & imaging results that were available during my care of the patient were reviewed by me and considered in my medical decision making (see chart for details).        K is low, so pt was given 40 meq orally and 2 IV runs of 10meq.  Pt is told to hold lasix and hctz for 3 days.  She is instructed to f/u with her pcp in 1 week for K recheck.  Return if worse.  Final Clinical Impressions(s) / ED Diagnoses   Final diagnoses:  Hypokalemia    ED Discharge Orders         Ordered    potassium chloride SA (KLOR-CON) 20 MEQ tablet  Daily     10 /04/20 1829           Isla Pence, MD 01/07/19 1831

## 2019-01-10 ENCOUNTER — Other Ambulatory Visit: Payer: Self-pay

## 2019-01-10 DIAGNOSIS — Z20822 Contact with and (suspected) exposure to covid-19: Secondary | ICD-10-CM

## 2019-01-11 LAB — NOVEL CORONAVIRUS, NAA: SARS-CoV-2, NAA: NOT DETECTED

## 2019-04-11 ENCOUNTER — Other Ambulatory Visit: Payer: Medicare Other

## 2019-04-13 ENCOUNTER — Ambulatory Visit (INDEPENDENT_AMBULATORY_CARE_PROVIDER_SITE_OTHER): Payer: Medicare Other | Admitting: Plastic Surgery

## 2019-04-13 ENCOUNTER — Encounter: Payer: Self-pay | Admitting: Plastic Surgery

## 2019-04-13 ENCOUNTER — Other Ambulatory Visit: Payer: Self-pay

## 2019-04-13 VITALS — BP 150/93 | HR 95 | Temp 97.8°F | Ht 62.0 in | Wt 167.0 lb

## 2019-04-13 DIAGNOSIS — M546 Pain in thoracic spine: Secondary | ICD-10-CM | POA: Diagnosis not present

## 2019-04-13 DIAGNOSIS — G8929 Other chronic pain: Secondary | ICD-10-CM | POA: Diagnosis not present

## 2019-04-13 DIAGNOSIS — N62 Hypertrophy of breast: Secondary | ICD-10-CM

## 2019-04-13 DIAGNOSIS — M542 Cervicalgia: Secondary | ICD-10-CM | POA: Diagnosis not present

## 2019-04-13 DIAGNOSIS — N644 Mastodynia: Secondary | ICD-10-CM

## 2019-04-13 NOTE — Progress Notes (Signed)
Patient ID: Brooke Cruz, female    DOB: Jun 10, 1970, 49 y.o.   MRN: [id number]   Chief Complaint  Patient presents with  . Follow-up    after biopsy  . Breast Problem    Mammary Hyperplasia: The patient is a 49 y.o. female with a history of mammary hyperplasia for several years.  She was seen a year ago for the same situation.  She needed a mammogram and some work-up.  She did get that and had an issue with concerns of the left breast.  This was biopsied and was resolved.  She is due for a new mammogram.  She has extremely large breasts causing symptoms that include the following: Back pain in the upper and lower back, including neck pain. She pulls or pins her bra straps to provide better lift and relief of the pressure and pain. She notices relief by holding her breast up manually.  Her shoulder straps cause grooves and pain and pressure that requires padding for relief. Pain medication is sometimes required with motrin and tylenol.  Activities that are hindered by enlarged breasts include: exercise and running.  She states that the pain of her back and neck has gotten worse over the past year.  She has been evaluated by pain management, neurosurgery PT and multiple ER visits for the neck and back pain.  Her breasts are extremely large and fairly symmetric.  She has hyperpigmentation of the inframammary area on both sides.  The sternal to nipple distance on the right is 33 cm and the left is 33 cm.  The IMF distance is 14 cm.  She is 5 feet 2 inches tall and weighs 167 pounds.  Preoperative bra size = 38 DDD cup.  She would like to be a C or D cup the estimated excess breast tissue to be removed at the time of surgery = 700 grams on the left and 700 grams on the right.     Review of Systems  Past Medical History:  Diagnosis Date  . Anxiety and depression   . Chronic back pain   . Depression   . Diverticulitis   . Hyperlipemia   . Hypertension   . Peripheral neuropathy   .  PTSD (post-traumatic stress disorder)     Past Surgical History:  Procedure Laterality Date  . CESAREAN SECTION     x 2  . NASAL SINUS SURGERY        Current Outpatient Medications:  .  amLODIPine Besylate (NORVASC PO), Take 10 mg by mouth daily., Disp: , Rfl:  .  busPIRone (BUSPAR) 10 MG tablet, Take 10 mg by mouth 2 (two) times daily., Disp: , Rfl:  .  carbamazepine (CARBATROL) 100 MG 12 hr capsule, Take 100 mg by mouth 2 (two) times daily as needed., Disp: , Rfl:  .  celecoxib (CELEBREX) 200 MG capsule, Take 200 mg by mouth daily., Disp: , Rfl:  .  cetirizine (ZYRTEC) 10 MG tablet, Take by mouth daily., Disp: , Rfl:  .  citalopram (CELEXA) 40 MG tablet, daily., Disp: , Rfl:  .  Cyanocobalamin (VITAMIN B-12 PO), Take by mouth daily., Disp: , Rfl:  .  cyclobenzaprine (FLEXERIL) 10 MG tablet, Take 1 tablet (10 mg total) by mouth 3 (three) times daily as needed., Disp: 90 tablet, Rfl: 6 .  DULoxetine (CYMBALTA) 60 MG capsule, Take 1 capsule (60 mg total) by mouth daily., Disp: 90 capsule, Rfl: 4 .  EPINEPHrine (EPIPEN) 0.3 mg/0.3 mL DEVI, Inject 0.3  mLs (0.3 mg total) into the muscle as needed., Disp: 2 Device, Rfl: 1 .  gabapentin (NEURONTIN) 600 MG tablet, Take 600 mg by mouth 3 (three) times daily., Disp: , Rfl:  .  gabapentin (NEURONTIN) 800 MG tablet, Take 800 mg by mouth at bedtime., Disp: , Rfl:  .  hydrochlorothiazide (HYDRODIURIL) 25 MG tablet, Take 25 mg by mouth 2 (two) times daily., Disp: , Rfl:  .  HYDROcodone-acetaminophen (NORCO/VICODIN) 5-325 MG tablet, Take 1 tablet by mouth every 6 (six) hours as needed., Disp: 10 tablet, Rfl: 0 .  ibuprofen (ADVIL,MOTRIN) 800 MG tablet, Take 1 tablet (800 mg total) by mouth every 8 (eight) hours as needed., Disp: 21 tablet, Rfl: 0 .  ondansetron (ZOFRAN) 4 MG tablet, Take 1 tablet (4 mg total) by mouth every 6 (six) hours., Disp: 12 tablet, Rfl: 0 .  potassium chloride SA (KLOR-CON) 20 MEQ tablet, Take 1 tablet (20 mEq total) by mouth  daily., Disp: 30 tablet, Rfl: 0 .  predniSONE (DELTASONE) 20 MG tablet, Take 60 mg daily x 2 days then 40 mg daily x 2 days then 20 mg daily x 2 days, Disp: 12 tablet, Rfl: 0 .  traZODone (DESYREL) 50 MG tablet, Take 50 mg by mouth at bedtime., Disp: , Rfl:    Objective:   Vitals:   04/13/19 1019  BP: (!) 150/93  Pulse: 95  Temp: 97.8 F (36.6 C)  SpO2: 99%    Physical Exam  Assessment & Plan:  Chronic bilateral thoracic back pain  Neck pain  Symptomatic mammary hypertrophy  Bilateral breast reduction with possible liposuction Pictures were obtained of the patient and placed in the chart with the patient's or guardian's permission.   Wallace Going, DO   The 21st Century Cures Act was signed into law in 2016 which includes the topic of electronic health records.  This provides immediate access to information in MyChart.  This includes consultation notes, operative notes, office notes, lab results and pathology reports.  If you have any questions about what you read please let us know at your next visit or call us at the office.  We are right here with you.

## 2019-04-22 ENCOUNTER — Encounter: Payer: Self-pay | Admitting: Plastic Surgery

## 2019-04-24 ENCOUNTER — Other Ambulatory Visit: Payer: Medicare Other

## 2019-04-24 DIAGNOSIS — N644 Mastodynia: Secondary | ICD-10-CM | POA: Insufficient documentation

## 2019-05-08 ENCOUNTER — Other Ambulatory Visit: Payer: Self-pay

## 2019-05-08 ENCOUNTER — Encounter: Payer: Self-pay | Admitting: Plastic Surgery

## 2019-05-08 ENCOUNTER — Ambulatory Visit (INDEPENDENT_AMBULATORY_CARE_PROVIDER_SITE_OTHER): Payer: Medicare Other | Admitting: Plastic Surgery

## 2019-05-08 VITALS — BP 125/84 | Temp 98.0°F | Ht 62.0 in | Wt 170.4 lb

## 2019-05-08 DIAGNOSIS — N62 Hypertrophy of breast: Secondary | ICD-10-CM

## 2019-05-08 MED ORDER — ONDANSETRON HCL 4 MG PO TABS: 4.0000 mg | ORAL_TABLET | Freq: Three times a day (TID) | ORAL | 0 refills | Status: AC | PRN

## 2019-05-08 MED ORDER — IBUPROFEN 600 MG PO TABS: 600.0000 mg | ORAL_TABLET | Freq: Four times a day (QID) | ORAL | 0 refills | Status: DC | PRN

## 2019-05-08 MED ORDER — CEPHALEXIN 500 MG PO CAPS: 500.0000 mg | ORAL_CAPSULE | Freq: Four times a day (QID) | ORAL | 0 refills | Status: AC

## 2019-05-08 NOTE — H&P (View-Only) (Signed)
No diagnosis found.    Patient ID: Brooke Cruz, female    DOB: 26-Jun-1970, 49 y.o.   MRN: [id number]   History of Present Illness: Brooke Cruz is a 49 y.o.  female  with a history of mammary hyperplasia.  She presents for preoperative evaluation for upcoming procedure, bilateral breast reduction, scheduled for 05/24/2019 with Dr. Ulice Bold.  Last mammogram 06/09/2018.  Findings of dense area in left breast, biopsied and resolved.  She is a non-smoker.  No personal history of breast cancer but has a maternal aunt that had breast cancer.  Allergy to latex and sulfa drugs.  Does not have currently swollen legs but takes HCTZ to control chronic leg swelling. Denies CHF. PMH significant for HTN, chronic back/neck pain, fibromyalgia, and neuropathy.    She has extremely large breasts causing symptoms that include the following: Back pain (upper and lower) and neck pain. She frequently pins bra cups higher on straps for better lift and relief. Notices relief when holding breast up in her hands. Shoulder straps causing grooves, pain occasionally requiring padding. Pain medication is sometimes required with motrin and tylenol.  She has marks and scars on her shoulders due to the weight. Activities that are hindered by enlarged breasts include: running and heavy exercise. She has been evaluated by pain management, neurosurgery PT and multiple ER visits for neck and back pain.  Her breasts are large and fairly symmetric.  The sternal to next nipple distance on the right is 33 cm, on the left is 33 cm.  The IMF distance is 14 cm.  She is 5 feet 2 inches tall and weighs 170 pounds.  Preoperative bra size equals 38 DDD cup.  She would like to be a C cup and would not be unhappy if she woke up as a smaller C.  Estimated excess breast tissue to be removed at time of surgery is 700 g on the left and 700 g on the right.  The patient has not had problems with anesthesia.   Past Medical  History: Allergies: Allergies  Allergen Reactions  . Influenza Vaccines Anaphylaxis and Rash  . Eggs Or Egg-Derived Products Diarrhea and Nausea Only  . Latex   . Onion   . Other     Flu vaccine  . Sulfa Antibiotics Hives  . Corn Oil Other (See Comments)    Current Medications:  Current Outpatient Medications:  .  amLODIPine Besylate (NORVASC PO), Take 10 mg by mouth daily., Disp: , Rfl:  .  busPIRone (BUSPAR) 10 MG tablet, Take 10 mg by mouth 2 (two) times daily., Disp: , Rfl:  .  carbamazepine (CARBATROL) 100 MG 12 hr capsule, Take 100 mg by mouth 2 (two) times daily as needed., Disp: , Rfl:  .  celecoxib (CELEBREX) 200 MG capsule, Take 200 mg by mouth daily., Disp: , Rfl:  .  cetirizine (ZYRTEC) 10 MG tablet, Take by mouth daily., Disp: , Rfl:  .  citalopram (CELEXA) 40 MG tablet, daily., Disp: , Rfl:  .  Cyanocobalamin (VITAMIN B-12 PO), Take by mouth daily., Disp: , Rfl:  .  cyclobenzaprine (FLEXERIL) 10 MG tablet, Take 1 tablet (10 mg total) by mouth 3 (three) times daily as needed., Disp: 90 tablet, Rfl: 6 .  DULoxetine (CYMBALTA) 60 MG capsule, Take 1 capsule (60 mg total) by mouth daily., Disp: 90 capsule, Rfl: 4 .  EPINEPHrine (EPIPEN) 0.3 mg/0.3 mL DEVI, Inject 0.3 mLs (0.3 mg total) into the muscle as needed., Disp: 2  Device, Rfl: 1 .  gabapentin (NEURONTIN) 600 MG tablet, Take 600 mg by mouth 3 (three) times daily., Disp: , Rfl:  .  gabapentin (NEURONTIN) 800 MG tablet, Take 800 mg by mouth at bedtime., Disp: , Rfl:  .  hydrochlorothiazide (HYDRODIURIL) 25 MG tablet, Take 25 mg by mouth 2 (two) times daily., Disp: , Rfl:  .  HYDROcodone-acetaminophen (NORCO/VICODIN) 5-325 MG tablet, Take 1 tablet by mouth every 6 (six) hours as needed., Disp: 10 tablet, Rfl: 0 .  ibuprofen (ADVIL,MOTRIN) 800 MG tablet, Take 1 tablet (800 mg total) by mouth every 8 (eight) hours as needed., Disp: 21 tablet, Rfl: 0 .  ondansetron (ZOFRAN) 4 MG tablet, Take 1 tablet (4 mg total) by mouth  every 6 (six) hours., Disp: 12 tablet, Rfl: 0 .  potassium chloride SA (KLOR-CON) 20 MEQ tablet, Take 1 tablet (20 mEq total) by mouth daily., Disp: 30 tablet, Rfl: 0 .  predniSONE (DELTASONE) 20 MG tablet, Take 60 mg daily x 2 days then 40 mg daily x 2 days then 20 mg daily x 2 days, Disp: 12 tablet, Rfl: 0 .  traZODone (DESYREL) 50 MG tablet, Take 50 mg by mouth at bedtime., Disp: , Rfl:   Past Medical Problems: Past Medical History:  Diagnosis Date  . Anxiety and depression   . Chronic back pain   . Depression   . Diverticulitis   . Hyperlipemia   . Hypertension   . Peripheral neuropathy   . PTSD (post-traumatic stress disorder)     Past Surgical History: Past Surgical History:  Procedure Laterality Date  . CESAREAN SECTION     x 2  . NASAL SINUS SURGERY      Social History: Social History   Socioeconomic History  . Marital status: Single    Spouse name: Not on file  . Number of children: 4  . Years of education: Some college  . Highest education level: Not on file  Occupational History  . Occupation: Unemployed  Tobacco Use  . Smoking status: Never Smoker  . Smokeless tobacco: Never Used  Substance and Sexual Activity  . Alcohol use: No  . Drug use: No  . Sexual activity: Never    Birth control/protection: None  Other Topics Concern  . Not on file  Social History Narrative   Lives at home with children.   Right-handed.   2 cups caffeine per day.   Social Determinants of Health   Financial Resource Strain:   . Difficulty of Paying Living Expenses: Not on file  Food Insecurity:   . Worried About Programme researcher, broadcasting/film/video in the Last Year: Not on file  . Ran Out of Food in the Last Year: Not on file  Transportation Needs:   . Lack of Transportation (Medical): Not on file  . Lack of Transportation (Non-Medical): Not on file  Physical Activity:   . Days of Exercise per Week: Not on file  . Minutes of Exercise per Session: Not on file  Stress:   . Feeling of  Stress : Not on file  Social Connections:   . Frequency of Communication with Friends and Family: Not on file  . Frequency of Social Gatherings with Friends and Family: Not on file  . Attends Religious Services: Not on file  . Active Member of Clubs or Organizations: Not on file  . Attends Banker Meetings: Not on file  . Marital Status: Not on file  Intimate Partner Violence:   . Fear of Current  or Ex-Partner: Not on file  . Emotionally Abused: Not on file  . Physically Abused: Not on file  . Sexually Abused: Not on file    Family History: Family History  Problem Relation Age of Onset  . Hypertension Mother   . Fibromyalgia Mother   . Schizophrenia Father   . Schizophrenia Sister   . Colon cancer Maternal Grandmother   . Heart disease Maternal Grandfather   . Breast cancer Maternal Aunt     Review of Systems: Review of Systems  Constitutional: Negative for chills and fever.  HENT: Negative for congestion and sore throat.   Respiratory: Negative for cough and shortness of breath.   Cardiovascular: Negative for chest pain and palpitations.  Gastrointestinal: Negative for abdominal pain, nausea and vomiting.  Musculoskeletal: Positive for back pain, myalgias and neck pain.  Skin: Negative for itching and rash.    Physical Exam: Vital Signs BP 125/84 (Patient Position: Sitting)   Temp 98 F (36.7 C) (Temporal)   Ht 5\' 2"  (1.575 m)   Wt 170 lb 6.4 oz (77.3 kg)   SpO2 97%   BMI 31.17 kg/m  Physical Exam Nursing note reviewed.  Constitutional:      Appearance: Normal appearance. She is normal weight.  HENT:     Head: Normocephalic and atraumatic.  Eyes:     Extraocular Movements: Extraocular movements intact.  Cardiovascular:     Rate and Rhythm: Normal rate and regular rhythm.     Pulses: Normal pulses.     Heart sounds: Normal heart sounds.  Pulmonary:     Effort: Pulmonary effort is normal.     Breath sounds: Normal breath sounds. No wheezing,  rhonchi or rales.  Abdominal:     General: Bowel sounds are normal.     Palpations: Abdomen is soft.  Musculoskeletal:        General: No swelling. Normal range of motion.     Cervical back: Normal range of motion.  Skin:    General: Skin is warm and dry.     Coloration: Skin is not pale.     Findings: No erythema or rash.  Neurological:     General: No focal deficit present.     Mental Status: She is alert and oriented to person, place, and time.  Psychiatric:        Mood and Affect: Mood normal.        Behavior: Behavior normal.        Thought Content: Thought content normal.        Judgment: Judgment normal.     Assessment/Plan:   Mrs. Gorton scheduled for Bilateral breast reduction with Dr. Marla Roe.  Risks, benefits, and alternatives of procedure discussed, questions answered and consent obtained.   Patient is a non-smoker with no personal history of breast cancer or blood clots.  Latex and sulfa drug allergy.  Caprini Score: Moderate (4 risk factors). Risks include 49 year old female, BMI > 25, length of planned surgery.  Recommendation for mechanical or pharmacological prophylaxis.  Post-op Rx sent to pharmacy: Keflex, Zofran, 800 mg ibuprofen.  Patient declined hydrocodone and will plan to use ibuprofen instead.  The risk that can be encountered with breast reduction were discussed and include the following but not limited to these:  Breast asymmetry, fluid accumulation, firmness of the breast, inability to breast feed, loss of nipple or areola, skin loss, decrease or no nipple sensation, fat necrosis of the breast tissue, bleeding, infection, healing delay.  There are risks of anesthesia, changes  to skin sensation and injury to nerves or blood vessels.  The muscle can be temporarily or permanently injured.  You may have an allergic reaction to tape, suture, glue, blood products which can result in skin discoloration, swelling, pain, skin lesions, poor healing.  Any of  these can lead to the need for revisonal surgery or stage procedures.  A reduction has potential to interfere with diagnostic procedures.  Nipple or breast piercing can increase risks of infection.  This procedure is best done when the breast is fully developed.  Changes in the breast will continue to occur over time.  Pregnancy can alter the outcomes of previous breast reduction surgery, weight gain and weigh loss can also effect the long term appearance.   The 21st Century Cures Act was signed into law in 2016 which includes the topic of electronic health records.  This provides immediate access to information in MyChart.  This includes consultation notes, operative notes, office notes, lab results and pathology reports.  If you have any questions about what you read please let us know at your next visit or call us at the office.  We are right here with you.   Electronically signed by: Eldridge Abrahams, PA-C 05/08/2019 1:05 PM

## 2019-05-08 NOTE — Progress Notes (Signed)
No diagnosis found.    Patient ID: Brooke Cruz, female    DOB: 26-Jun-1970, 49 y.o.   MRN: [id number]   History of Present Illness: Brooke Cruz is a 49 y.o.  female  with a history of mammary hyperplasia.  She presents for preoperative evaluation for upcoming procedure, bilateral breast reduction, scheduled for 05/24/2019 with Dr. Ulice Bold.  Last mammogram 06/09/2018.  Findings of dense area in left breast, biopsied and resolved.  She is a non-smoker.  No personal history of breast cancer but has a maternal aunt that had breast cancer.  Allergy to latex and sulfa drugs.  Does not have currently swollen legs but takes HCTZ to control chronic leg swelling. Denies CHF. PMH significant for HTN, chronic back/neck pain, fibromyalgia, and neuropathy.    She has extremely large breasts causing symptoms that include the following: Back pain (upper and lower) and neck pain. She frequently pins bra cups higher on straps for better lift and relief. Notices relief when holding breast up in her hands. Shoulder straps causing grooves, pain occasionally requiring padding. Pain medication is sometimes required with motrin and tylenol.  She has marks and scars on her shoulders due to the weight. Activities that are hindered by enlarged breasts include: running and heavy exercise. She has been evaluated by pain management, neurosurgery PT and multiple ER visits for neck and back pain.  Her breasts are large and fairly symmetric.  The sternal to next nipple distance on the right is 33 cm, on the left is 33 cm.  The IMF distance is 14 cm.  She is 5 feet 2 inches tall and weighs 170 pounds.  Preoperative bra size equals 38 DDD cup.  She would like to be a C cup and would not be unhappy if she woke up as a smaller C.  Estimated excess breast tissue to be removed at time of surgery is 700 g on the left and 700 g on the right.  The patient has not had problems with anesthesia.   Past Medical  History: Allergies: Allergies  Allergen Reactions  . Influenza Vaccines Anaphylaxis and Rash  . Eggs Or Egg-Derived Products Diarrhea and Nausea Only  . Latex   . Onion   . Other     Flu vaccine  . Sulfa Antibiotics Hives  . Corn Oil Other (See Comments)    Current Medications:  Current Outpatient Medications:  .  amLODIPine Besylate (NORVASC PO), Take 10 mg by mouth daily., Disp: , Rfl:  .  busPIRone (BUSPAR) 10 MG tablet, Take 10 mg by mouth 2 (two) times daily., Disp: , Rfl:  .  carbamazepine (CARBATROL) 100 MG 12 hr capsule, Take 100 mg by mouth 2 (two) times daily as needed., Disp: , Rfl:  .  celecoxib (CELEBREX) 200 MG capsule, Take 200 mg by mouth daily., Disp: , Rfl:  .  cetirizine (ZYRTEC) 10 MG tablet, Take by mouth daily., Disp: , Rfl:  .  citalopram (CELEXA) 40 MG tablet, daily., Disp: , Rfl:  .  Cyanocobalamin (VITAMIN B-12 PO), Take by mouth daily., Disp: , Rfl:  .  cyclobenzaprine (FLEXERIL) 10 MG tablet, Take 1 tablet (10 mg total) by mouth 3 (three) times daily as needed., Disp: 90 tablet, Rfl: 6 .  DULoxetine (CYMBALTA) 60 MG capsule, Take 1 capsule (60 mg total) by mouth daily., Disp: 90 capsule, Rfl: 4 .  EPINEPHrine (EPIPEN) 0.3 mg/0.3 mL DEVI, Inject 0.3 mLs (0.3 mg total) into the muscle as needed., Disp: 2  Device, Rfl: 1 .  gabapentin (NEURONTIN) 600 MG tablet, Take 600 mg by mouth 3 (three) times daily., Disp: , Rfl:  .  gabapentin (NEURONTIN) 800 MG tablet, Take 800 mg by mouth at bedtime., Disp: , Rfl:  .  hydrochlorothiazide (HYDRODIURIL) 25 MG tablet, Take 25 mg by mouth 2 (two) times daily., Disp: , Rfl:  .  HYDROcodone-acetaminophen (NORCO/VICODIN) 5-325 MG tablet, Take 1 tablet by mouth every 6 (six) hours as needed., Disp: 10 tablet, Rfl: 0 .  ibuprofen (ADVIL,MOTRIN) 800 MG tablet, Take 1 tablet (800 mg total) by mouth every 8 (eight) hours as needed., Disp: 21 tablet, Rfl: 0 .  ondansetron (ZOFRAN) 4 MG tablet, Take 1 tablet (4 mg total) by mouth  every 6 (six) hours., Disp: 12 tablet, Rfl: 0 .  potassium chloride SA (KLOR-CON) 20 MEQ tablet, Take 1 tablet (20 mEq total) by mouth daily., Disp: 30 tablet, Rfl: 0 .  predniSONE (DELTASONE) 20 MG tablet, Take 60 mg daily x 2 days then 40 mg daily x 2 days then 20 mg daily x 2 days, Disp: 12 tablet, Rfl: 0 .  traZODone (DESYREL) 50 MG tablet, Take 50 mg by mouth at bedtime., Disp: , Rfl:   Past Medical Problems: Past Medical History:  Diagnosis Date  . Anxiety and depression   . Chronic back pain   . Depression   . Diverticulitis   . Hyperlipemia   . Hypertension   . Peripheral neuropathy   . PTSD (post-traumatic stress disorder)     Past Surgical History: Past Surgical History:  Procedure Laterality Date  . CESAREAN SECTION     x 2  . NASAL SINUS SURGERY      Social History: Social History   Socioeconomic History  . Marital status: Single    Spouse name: Not on file  . Number of children: 4  . Years of education: Some college  . Highest education level: Not on file  Occupational History  . Occupation: Unemployed  Tobacco Use  . Smoking status: Never Smoker  . Smokeless tobacco: Never Used  Substance and Sexual Activity  . Alcohol use: No  . Drug use: No  . Sexual activity: Never    Birth control/protection: None  Other Topics Concern  . Not on file  Social History Narrative   Lives at home with children.   Right-handed.   2 cups caffeine per day.   Social Determinants of Health   Financial Resource Strain:   . Difficulty of Paying Living Expenses: Not on file  Food Insecurity:   . Worried About Running Out of Food in the Last Year: Not on file  . Ran Out of Food in the Last Year: Not on file  Transportation Needs:   . Lack of Transportation (Medical): Not on file  . Lack of Transportation (Non-Medical): Not on file  Physical Activity:   . Days of Exercise per Week: Not on file  . Minutes of Exercise per Session: Not on file  Stress:   . Feeling of  Stress : Not on file  Social Connections:   . Frequency of Communication with Friends and Family: Not on file  . Frequency of Social Gatherings with Friends and Family: Not on file  . Attends Religious Services: Not on file  . Active Member of Clubs or Organizations: Not on file  . Attends Club or Organization Meetings: Not on file  . Marital Status: Not on file  Intimate Partner Violence:   . Fear of Current   or Ex-Partner: Not on file  . Emotionally Abused: Not on file  . Physically Abused: Not on file  . Sexually Abused: Not on file    Family History: Family History  Problem Relation Age of Onset  . Hypertension Mother   . Fibromyalgia Mother   . Schizophrenia Father   . Schizophrenia Sister   . Colon cancer Maternal Grandmother   . Heart disease Maternal Grandfather   . Breast cancer Maternal Aunt     Review of Systems: Review of Systems  Constitutional: Negative for chills and fever.  HENT: Negative for congestion and sore throat.   Respiratory: Negative for cough and shortness of breath.   Cardiovascular: Negative for chest pain and palpitations.  Gastrointestinal: Negative for abdominal pain, nausea and vomiting.  Musculoskeletal: Positive for back pain, myalgias and neck pain.  Skin: Negative for itching and rash.    Physical Exam: Vital Signs BP 125/84 (Patient Position: Sitting)   Temp 98 F (36.7 C) (Temporal)   Ht 5\' 2"  (1.575 m)   Wt 170 lb 6.4 oz (77.3 kg)   SpO2 97%   BMI 31.17 kg/m  Physical Exam Nursing note reviewed.  Constitutional:      Appearance: Normal appearance. She is normal weight.  HENT:     Head: Normocephalic and atraumatic.  Eyes:     Extraocular Movements: Extraocular movements intact.  Cardiovascular:     Rate and Rhythm: Normal rate and regular rhythm.     Pulses: Normal pulses.     Heart sounds: Normal heart sounds.  Pulmonary:     Effort: Pulmonary effort is normal.     Breath sounds: Normal breath sounds. No wheezing,  rhonchi or rales.  Abdominal:     General: Bowel sounds are normal.     Palpations: Abdomen is soft.  Musculoskeletal:        General: No swelling. Normal range of motion.     Cervical back: Normal range of motion.  Skin:    General: Skin is warm and dry.     Coloration: Skin is not pale.     Findings: No erythema or rash.  Neurological:     General: No focal deficit present.     Mental Status: She is alert and oriented to person, place, and time.  Psychiatric:        Mood and Affect: Mood normal.        Behavior: Behavior normal.        Thought Content: Thought content normal.        Judgment: Judgment normal.     Assessment/Plan:   Mrs. Gorton scheduled for Bilateral breast reduction with Dr. Marla Roe.  Risks, benefits, and alternatives of procedure discussed, questions answered and consent obtained.   Patient is a non-smoker with no personal history of breast cancer or blood clots.  Latex and sulfa drug allergy.  Caprini Score: Moderate (4 risk factors). Risks include 49 year old female, BMI > 25, length of planned surgery.  Recommendation for mechanical or pharmacological prophylaxis.  Post-op Rx sent to pharmacy: Keflex, Zofran, 800 mg ibuprofen.  Patient declined hydrocodone and will plan to use ibuprofen instead.  The risk that can be encountered with breast reduction were discussed and include the following but not limited to these:  Breast asymmetry, fluid accumulation, firmness of the breast, inability to breast feed, loss of nipple or areola, skin loss, decrease or no nipple sensation, fat necrosis of the breast tissue, bleeding, infection, healing delay.  There are risks of anesthesia, changes  to skin sensation and injury to nerves or blood vessels.  The muscle can be temporarily or permanently injured.  You may have an allergic reaction to tape, suture, glue, blood products which can result in skin discoloration, swelling, pain, skin lesions, poor healing.  Any of  these can lead to the need for revisonal surgery or stage procedures.  A reduction has potential to interfere with diagnostic procedures.  Nipple or breast piercing can increase risks of infection.  This procedure is best done when the breast is fully developed.  Changes in the breast will continue to occur over time.  Pregnancy can alter the outcomes of previous breast reduction surgery, weight gain and weigh loss can also effect the long term appearance.   The 21st Century Cures Act was signed into law in 2016 which includes the topic of electronic health records.  This provides immediate access to information in MyChart.  This includes consultation notes, operative notes, office notes, lab results and pathology reports.  If you have any questions about what you read please let us know at your next visit or call us at the office.  We are right here with you.   Electronically signed by: Eldridge Abrahams, PA-C 05/08/2019 1:05 PM

## 2019-05-18 ENCOUNTER — Encounter (HOSPITAL_BASED_OUTPATIENT_CLINIC_OR_DEPARTMENT_OTHER): Payer: Self-pay | Admitting: Plastic Surgery

## 2019-05-18 ENCOUNTER — Encounter (HOSPITAL_BASED_OUTPATIENT_CLINIC_OR_DEPARTMENT_OTHER)
Admission: RE | Admit: 2019-05-18 | Discharge: 2019-05-18 | Disposition: A | Discharge status: Home / Self Care | Payer: Medicare Other | Source: Ambulatory Visit | Attending: Plastic Surgery | Admitting: Plastic Surgery

## 2019-05-18 ENCOUNTER — Other Ambulatory Visit: Payer: Self-pay

## 2019-05-18 DIAGNOSIS — N62 Hypertrophy of breast: Secondary | ICD-10-CM | POA: Diagnosis not present

## 2019-05-18 DIAGNOSIS — I10 Essential (primary) hypertension: Secondary | ICD-10-CM | POA: Insufficient documentation

## 2019-05-18 DIAGNOSIS — E785 Hyperlipidemia, unspecified: Secondary | ICD-10-CM | POA: Diagnosis not present

## 2019-05-18 DIAGNOSIS — Z01812 Encounter for preprocedural laboratory examination: Secondary | ICD-10-CM | POA: Insufficient documentation

## 2019-05-18 DIAGNOSIS — Z79899 Other long term (current) drug therapy: Secondary | ICD-10-CM | POA: Diagnosis not present

## 2019-05-18 LAB — BASIC METABOLIC PANEL
Anion gap: 12 (ref 5–15)
BUN: 9 mg/dL (ref 6–20)
CO2: 24 mmol/L (ref 22–32)
Calcium: 8.9 mg/dL (ref 8.9–10.3)
Chloride: 104 mmol/L (ref 98–111)
Creatinine, Ser: 0.64 mg/dL (ref 0.44–1.00)
GFR calc Af Amer: >60 mL/min (ref >60)
GFR calc non Af Amer: >60 mL/min (ref >60)
Glucose, Bld: 150 mg/dL — ABNORMAL HIGH (ref 70–99)
Potassium: 3.7 mmol/L (ref 3.5–5.1)
Sodium: 140 mmol/L (ref 135–145)

## 2019-05-18 LAB — POCT PREGNANCY, URINE: Preg Test, Ur: NEGATIVE

## 2019-05-21 ENCOUNTER — Other Ambulatory Visit (HOSPITAL_COMMUNITY)
Admission: RE | Admit: 2019-05-21 | Discharge: 2019-05-21 | Disposition: A | Discharge status: Home / Self Care | Payer: Medicare Other | Source: Ambulatory Visit | Attending: Plastic Surgery | Admitting: Plastic Surgery

## 2019-05-21 DIAGNOSIS — Z01812 Encounter for preprocedural laboratory examination: Secondary | ICD-10-CM | POA: Diagnosis present

## 2019-05-21 DIAGNOSIS — Z20822 Contact with and (suspected) exposure to covid-19: Secondary | ICD-10-CM | POA: Diagnosis not present

## 2019-05-21 LAB — SARS CORONAVIRUS 2 (TAT 6-24 HRS): SARS Coronavirus 2: NEGATIVE

## 2019-05-23 NOTE — Progress Notes (Addendum)
Patient updated on new surgery time. Pt is to arrive at 0830 at [facility] on 05/24/19

## 2019-05-24 ENCOUNTER — Encounter (HOSPITAL_BASED_OUTPATIENT_CLINIC_OR_DEPARTMENT_OTHER): Payer: Self-pay | Admitting: Plastic Surgery

## 2019-05-24 ENCOUNTER — Ambulatory Visit (HOSPITAL_BASED_OUTPATIENT_CLINIC_OR_DEPARTMENT_OTHER)
Admission: RE | Admit: 2019-05-24 | Discharge: 2019-05-24 | Disposition: A | Discharge status: Home / Self Care | Payer: Medicare Other | Attending: Plastic Surgery | Admitting: Plastic Surgery

## 2019-05-24 ENCOUNTER — Other Ambulatory Visit: Payer: Self-pay

## 2019-05-24 ENCOUNTER — Ambulatory Visit (HOSPITAL_BASED_OUTPATIENT_CLINIC_OR_DEPARTMENT_OTHER): Payer: Medicare Other | Admitting: Certified Registered Nurse Anesthetist

## 2019-05-24 ENCOUNTER — Encounter (HOSPITAL_BASED_OUTPATIENT_CLINIC_OR_DEPARTMENT_OTHER)
Admission: RE | Disposition: A | Discharge status: Home / Self Care | Payer: Self-pay | Source: Home / Self Care | Attending: Plastic Surgery

## 2019-05-24 DIAGNOSIS — Z91040 Latex allergy status: Secondary | ICD-10-CM | POA: Diagnosis not present

## 2019-05-24 DIAGNOSIS — N62 Hypertrophy of breast: Secondary | ICD-10-CM | POA: Insufficient documentation

## 2019-05-24 DIAGNOSIS — I10 Essential (primary) hypertension: Secondary | ICD-10-CM | POA: Diagnosis not present

## 2019-05-24 DIAGNOSIS — G629 Polyneuropathy, unspecified: Secondary | ICD-10-CM | POA: Insufficient documentation

## 2019-05-24 DIAGNOSIS — Z882 Allergy status to sulfonamides status: Secondary | ICD-10-CM | POA: Insufficient documentation

## 2019-05-24 DIAGNOSIS — F329 Major depressive disorder, single episode, unspecified: Secondary | ICD-10-CM | POA: Diagnosis not present

## 2019-05-24 DIAGNOSIS — Z91018 Allergy to other foods: Secondary | ICD-10-CM | POA: Insufficient documentation

## 2019-05-24 DIAGNOSIS — N6011 Diffuse cystic mastopathy of right breast: Secondary | ICD-10-CM | POA: Insufficient documentation

## 2019-05-24 DIAGNOSIS — Z91012 Allergy to eggs: Secondary | ICD-10-CM | POA: Insufficient documentation

## 2019-05-24 DIAGNOSIS — J45909 Unspecified asthma, uncomplicated: Secondary | ICD-10-CM | POA: Insufficient documentation

## 2019-05-24 DIAGNOSIS — N6012 Diffuse cystic mastopathy of left breast: Secondary | ICD-10-CM | POA: Diagnosis not present

## 2019-05-24 DIAGNOSIS — M797 Fibromyalgia: Secondary | ICD-10-CM | POA: Diagnosis not present

## 2019-05-24 DIAGNOSIS — M542 Cervicalgia: Secondary | ICD-10-CM | POA: Insufficient documentation

## 2019-05-24 DIAGNOSIS — M549 Dorsalgia, unspecified: Secondary | ICD-10-CM | POA: Insufficient documentation

## 2019-05-24 DIAGNOSIS — Z887 Allergy status to serum and vaccine status: Secondary | ICD-10-CM | POA: Diagnosis not present

## 2019-05-24 DIAGNOSIS — F419 Anxiety disorder, unspecified: Secondary | ICD-10-CM | POA: Diagnosis not present

## 2019-05-24 DIAGNOSIS — G8929 Other chronic pain: Secondary | ICD-10-CM

## 2019-05-24 DIAGNOSIS — N6022 Fibroadenosis of left breast: Secondary | ICD-10-CM | POA: Insufficient documentation

## 2019-05-24 DIAGNOSIS — M546 Pain in thoracic spine: Secondary | ICD-10-CM

## 2019-05-24 DIAGNOSIS — Z79899 Other long term (current) drug therapy: Secondary | ICD-10-CM | POA: Insufficient documentation

## 2019-05-24 HISTORY — DX: Gastro-esophageal reflux disease without esophagitis: K21.9

## 2019-05-24 HISTORY — PX: BREAST REDUCTION SURGERY: SHX8

## 2019-05-24 HISTORY — DX: Unspecified asthma, uncomplicated: J45.909

## 2019-05-24 HISTORY — DX: Unspecified osteoarthritis, unspecified site: M19.90

## 2019-05-24 SURGERY — MAMMOPLASTY, REDUCTION
Anesthesia: General | Site: Breast | Laterality: Bilateral

## 2019-05-24 MED ORDER — METRONIDAZOLE IN NACL 5-0.79 MG/ML-% IV SOLN
500.0000 mg | INTRAVENOUS | Status: AC
  Administered 2019-05-24: 10:00:00 500 mg via INTRAVENOUS

## 2019-05-24 MED ORDER — SODIUM CHLORIDE 0.9% FLUSH: 3.0000 mL | Freq: Two times a day (BID) | INTRAVENOUS | Status: DC

## 2019-05-24 MED ORDER — HYDROMORPHONE HCL 1 MG/ML IJ SOLN
INTRAMUSCULAR | Status: AC
  Filled 2019-05-24: qty 0.5

## 2019-05-24 MED ORDER — HYDROMORPHONE HCL 1 MG/ML IJ SOLN
0.2500 mg | INTRAMUSCULAR | Status: DC | PRN
  Administered 2019-05-24 (×2): 0.5 mg via INTRAVENOUS

## 2019-05-24 MED ORDER — ONDANSETRON HCL 4 MG/2ML IJ SOLN
INTRAMUSCULAR | Status: AC
  Filled 2019-05-24: qty 2

## 2019-05-24 MED ORDER — PROPOFOL 500 MG/50ML IV EMUL
INTRAVENOUS | Status: DC | PRN
  Administered 2019-05-24: 25 ug/kg/min via INTRAVENOUS

## 2019-05-24 MED ORDER — METRONIDAZOLE IN NACL 5-0.79 MG/ML-% IV SOLN
INTRAVENOUS | Status: AC
  Filled 2019-05-24: qty 100

## 2019-05-24 MED ORDER — SODIUM CHLORIDE 0.9 % IV SOLN
INTRAVENOUS | Status: DC | PRN
  Administered 2019-05-24: 500 mL

## 2019-05-24 MED ORDER — OXYCODONE HCL 5 MG PO TABS: 5.0000 mg | ORAL_TABLET | Freq: Once | ORAL | Status: DC | PRN

## 2019-05-24 MED ORDER — SODIUM CHLORIDE (PF) 0.9 % IJ SOLN
INTRAMUSCULAR | Status: DC | PRN
  Administered 2019-05-24: 50 mL

## 2019-05-24 MED ORDER — PHENYLEPHRINE 40 MCG/ML (10ML) SYRINGE FOR IV PUSH (FOR BLOOD PRESSURE SUPPORT)
PREFILLED_SYRINGE | INTRAVENOUS | Status: AC
  Filled 2019-05-24: qty 10

## 2019-05-24 MED ORDER — ONDANSETRON HCL 4 MG/2ML IJ SOLN
INTRAMUSCULAR | Status: DC | PRN
  Administered 2019-05-24: 4 mg via INTRAVENOUS

## 2019-05-24 MED ORDER — PROPOFOL 10 MG/ML IV BOLUS
INTRAVENOUS | Status: AC
  Filled 2019-05-24: qty 40

## 2019-05-24 MED ORDER — PHENYLEPHRINE 40 MCG/ML (10ML) SYRINGE FOR IV PUSH (FOR BLOOD PRESSURE SUPPORT)
PREFILLED_SYRINGE | INTRAVENOUS | Status: DC | PRN
  Administered 2019-05-24: 80 ug via INTRAVENOUS
  Administered 2019-05-24: 40 ug via INTRAVENOUS
  Administered 2019-05-24: 80 ug via INTRAVENOUS

## 2019-05-24 MED ORDER — DEXAMETHASONE SODIUM PHOSPHATE 10 MG/ML IJ SOLN
INTRAMUSCULAR | Status: AC
  Filled 2019-05-24: qty 1

## 2019-05-24 MED ORDER — FENTANYL CITRATE (PF) 100 MCG/2ML IJ SOLN: 25.0000 ug | INTRAMUSCULAR | Status: DC | PRN

## 2019-05-24 MED ORDER — LACTATED RINGERS IV SOLN: INTRAVENOUS | Status: DC

## 2019-05-24 MED ORDER — BUPIVACAINE HCL (PF) 0.25 % IJ SOLN
INTRAMUSCULAR | Status: DC | PRN
  Administered 2019-05-24: 30 mL

## 2019-05-24 MED ORDER — FENTANYL CITRATE (PF) 100 MCG/2ML IJ SOLN
INTRAMUSCULAR | Status: AC
  Filled 2019-05-24: qty 2

## 2019-05-24 MED ORDER — LIDOCAINE HCL (CARDIAC) PF 100 MG/5ML IV SOSY
PREFILLED_SYRINGE | INTRAVENOUS | Status: DC | PRN
  Administered 2019-05-24: 40 mg via INTRAVENOUS

## 2019-05-24 MED ORDER — PROMETHAZINE HCL 25 MG/ML IJ SOLN: 6.2500 mg | INTRAMUSCULAR | Status: DC | PRN

## 2019-05-24 MED ORDER — MEPERIDINE HCL 25 MG/ML IJ SOLN: 6.2500 mg | INTRAMUSCULAR | Status: DC | PRN

## 2019-05-24 MED ORDER — FENTANYL CITRATE (PF) 100 MCG/2ML IJ SOLN
INTRAMUSCULAR | Status: DC | PRN
  Administered 2019-05-24 (×2): 50 ug via INTRAVENOUS
  Administered 2019-05-24: 25 ug via INTRAVENOUS
  Administered 2019-05-24: 50 ug via INTRAVENOUS
  Administered 2019-05-24: 25 ug via INTRAVENOUS

## 2019-05-24 MED ORDER — SODIUM CHLORIDE 0.9 % IV SOLN: 250.0000 mL | INTRAVENOUS | Status: DC | PRN

## 2019-05-24 MED ORDER — PROPOFOL 10 MG/ML IV BOLUS
INTRAVENOUS | Status: DC | PRN
  Administered 2019-05-24: 200 mg via INTRAVENOUS

## 2019-05-24 MED ORDER — OXYCODONE HCL 5 MG/5ML PO SOLN: 5.0000 mg | Freq: Once | ORAL | Status: DC | PRN

## 2019-05-24 MED ORDER — MIDAZOLAM HCL 2 MG/2ML IJ SOLN
INTRAMUSCULAR | Status: DC | PRN
  Administered 2019-05-24: 2 mg via INTRAVENOUS

## 2019-05-24 MED ORDER — LIDOCAINE-EPINEPHRINE 1 %-1:100000 IJ SOLN
INTRAMUSCULAR | Status: DC | PRN
  Administered 2019-05-24: 20 mL

## 2019-05-24 MED ORDER — MIDAZOLAM HCL 2 MG/2ML IJ SOLN
INTRAMUSCULAR | Status: AC
  Filled 2019-05-24: qty 2

## 2019-05-24 MED ORDER — CHLORHEXIDINE GLUCONATE CLOTH 2 % EX PADS: 6.0000 | MEDICATED_PAD | Freq: Once | CUTANEOUS | Status: DC

## 2019-05-24 MED ORDER — DEXAMETHASONE SODIUM PHOSPHATE 10 MG/ML IJ SOLN
INTRAMUSCULAR | Status: DC | PRN
  Administered 2019-05-24: 5 mg via INTRAVENOUS

## 2019-05-24 MED ORDER — SODIUM CHLORIDE 0.9% FLUSH: 3.0000 mL | INTRAVENOUS | Status: DC | PRN

## 2019-05-24 MED ORDER — LIDOCAINE 2% (20 MG/ML) 5 ML SYRINGE
INTRAMUSCULAR | Status: AC
  Filled 2019-05-24: qty 5

## 2019-05-24 MED ORDER — PROPOFOL 10 MG/ML IV BOLUS
INTRAVENOUS | Status: AC
  Filled 2019-05-24: qty 20

## 2019-05-24 SURGICAL SUPPLY — 65 items
BAG DECANTER FOR FLEXI CONT (MISCELLANEOUS) ×3 IMPLANT
BINDER BREAST LRG (GAUZE/BANDAGES/DRESSINGS) IMPLANT
BINDER BREAST MEDIUM (GAUZE/BANDAGES/DRESSINGS) IMPLANT
BINDER BREAST XLRG (GAUZE/BANDAGES/DRESSINGS) ×3 IMPLANT
BINDER BREAST XXLRG (GAUZE/BANDAGES/DRESSINGS) IMPLANT
BIOPATCH RED 1 DISK 7.0 (GAUZE/BANDAGES/DRESSINGS) IMPLANT
BIOPATCH RED 1IN DISK 7.0MM (GAUZE/BANDAGES/DRESSINGS)
BLADE HEX COATED 2.75 (ELECTRODE) ×3 IMPLANT
BLADE KNIFE PERSONA 10 (BLADE) ×6 IMPLANT
BLADE SURG 15 STRL LF DISP TIS (BLADE) IMPLANT
BLADE SURG 15 STRL SS (BLADE)
BNDG GAUZE ELAST 4 BULKY (GAUZE/BANDAGES/DRESSINGS) IMPLANT
CANISTER SUCT 1200ML W/VALVE (MISCELLANEOUS) ×3 IMPLANT
COVER BACK TABLE 60X90IN (DRAPES) ×3 IMPLANT
COVER MAYO STAND STRL (DRAPES) ×3 IMPLANT
COVER WAND RF STERILE (DRAPES) IMPLANT
DECANTER SPIKE VIAL GLASS SM (MISCELLANEOUS) ×3 IMPLANT
DERMABOND ADVANCED (GAUZE/BANDAGES/DRESSINGS) ×6
DERMABOND ADVANCED .7 DNX12 (GAUZE/BANDAGES/DRESSINGS) ×3 IMPLANT
DRAIN CHANNEL 19F RND (DRAIN) IMPLANT
DRAPE LAPAROSCOPIC ABDOMINAL (DRAPES) ×3 IMPLANT
DRSG PAD ABDOMINAL 8X10 ST (GAUZE/BANDAGES/DRESSINGS) ×6 IMPLANT
ELECT BLADE 4.0 EZ CLEAN MEGAD (MISCELLANEOUS) ×3
ELECT REM PT RETURN 9FT ADLT (ELECTROSURGICAL) ×3
ELECTRODE BLDE 4.0 EZ CLN MEGD (MISCELLANEOUS) ×1 IMPLANT
ELECTRODE REM PT RTRN 9FT ADLT (ELECTROSURGICAL) ×1 IMPLANT
EVACUATOR SILICONE 100CC (DRAIN) IMPLANT
GLOVE BIO SURGEON STRL SZ 6.5 (GLOVE) ×8 IMPLANT
GLOVE BIO SURGEONS STRL SZ 6.5 (GLOVE) ×4
GOWN STRL REUS W/ TWL LRG LVL3 (GOWN DISPOSABLE) ×4 IMPLANT
GOWN STRL REUS W/TWL LRG LVL3 (GOWN DISPOSABLE) ×8
NDL SAFETY ECLIPSE 18X1.5 (NEEDLE) IMPLANT
NEEDLE HYPO 18GX1.5 SHARP (NEEDLE)
NEEDLE HYPO 25X1 1.5 SAFETY (NEEDLE) ×3 IMPLANT
NS IRRIG 1000ML POUR BTL (IV SOLUTION) IMPLANT
PACK BASIN DAY SURGERY FS (CUSTOM PROCEDURE TRAY) ×3 IMPLANT
PAD ALCOHOL SWAB (MISCELLANEOUS) IMPLANT
PENCIL SMOKE EVACUATOR (MISCELLANEOUS) ×3 IMPLANT
SLEEVE SCD COMPRESS KNEE MED (MISCELLANEOUS) ×3 IMPLANT
SPONGE LAP 18X18 RF (DISPOSABLE) ×12 IMPLANT
STRIP SUTURE WOUND CLOSURE 1/2 (MISCELLANEOUS) ×6 IMPLANT
SUT MNCRL AB 4-0 PS2 18 (SUTURE) ×30 IMPLANT
SUT MON AB 3-0 SH 27 (SUTURE) ×6
SUT MON AB 3-0 SH27 (SUTURE) ×3 IMPLANT
SUT MON AB 5-0 PS2 18 (SUTURE) ×21 IMPLANT
SUT PDS 3-0 CT2 (SUTURE)
SUT PDS AB 2-0 CT2 27 (SUTURE) IMPLANT
SUT PDS II 3-0 CT2 27 ABS (SUTURE) IMPLANT
SUT SILK 3 0 PS 1 (SUTURE) IMPLANT
SUT VIC AB 3-0 SH 27 (SUTURE)
SUT VIC AB 3-0 SH 27X BRD (SUTURE) IMPLANT
SUT VICRYL 4-0 PS2 18IN ABS (SUTURE) IMPLANT
SYR 3ML 23GX1 SAFETY (SYRINGE) ×3 IMPLANT
SYR 50ML LL SCALE MARK (SYRINGE) ×3 IMPLANT
SYR BULB IRRIGATION 50ML (SYRINGE) ×3 IMPLANT
SYR CONTROL 10ML LL (SYRINGE) ×3 IMPLANT
TAPE MEASURE VINYL STERILE (MISCELLANEOUS) IMPLANT
TOWEL GREEN STERILE FF (TOWEL DISPOSABLE) ×6 IMPLANT
TRAY DSU PREP LF (CUSTOM PROCEDURE TRAY) ×3 IMPLANT
TUBE CONNECTING 20'X1/4 (TUBING) ×1
TUBE CONNECTING 20X1/4 (TUBING) ×2 IMPLANT
TUBING INFILTRATION IT-10001 (TUBING) IMPLANT
TUBING SET GRADUATE ASPIR 12FT (MISCELLANEOUS) IMPLANT
UNDERPAD 30X36 HEAVY ABSORB (UNDERPADS AND DIAPERS) ×6 IMPLANT
YANKAUER SUCT BULB TIP NO VENT (SUCTIONS) ×3 IMPLANT

## 2019-05-24 NOTE — Interval H&P Note (Signed)
History and Physical Interval Note:  05/24/2019 8:36 AM  Brooke Cruz  has presented today for surgery, with the diagnosis of mammary hypertrophy.  The various methods of treatment have been discussed with the patient and family. After consideration of risks, benefits and other options for treatment, the patient has consented to  Procedure(s) with comments: BREAST REDUCTION WITH LIPOSUCTION (Bilateral) - 3.5 hours as a surgical intervention.  The patient's history has been reviewed, patient examined, no change in status, stable for surgery.  I have reviewed the patient's chart and labs.  Questions were answered to the patient's satisfaction.     Alena Bills Dedria Endres

## 2019-05-24 NOTE — Anesthesia Postprocedure Evaluation (Signed)
Anesthesia Post Note  Patient: Brooke Cruz  Procedure(s) Performed: mammary reduction (Bilateral Breast)     Patient location during evaluation: PACU Anesthesia Type: General Level of consciousness: awake and alert Pain management: pain level controlled Vital Signs Assessment: post-procedure vital signs reviewed and stable Respiratory status: spontaneous breathing, nonlabored ventilation and respiratory function stable Cardiovascular status: blood pressure returned to baseline and stable Postop Assessment: no apparent nausea or vomiting Anesthetic complications: no    Last Vitals:  Vitals:   05/24/19 1345 05/24/19 1415  BP: 140/77 134/84  Pulse: (!) 106 (!) 114  Resp: 11 11  Temp:  37.2 C  SpO2: 98% 91%    Last Pain:  Vitals:   05/24/19 1416  TempSrc:   PainSc: 2                  Lowella Curb

## 2019-05-24 NOTE — Anesthesia Procedure Notes (Signed)
Procedure Name: LMA Insertion Date/Time: 05/24/2019 10:02 AM Performed by: Yolonda Kida, CRNA Pre-anesthesia Checklist: Patient identified, Emergency Drugs available, Suction available and Patient being monitored Patient Re-evaluated:Patient Re-evaluated prior to induction Oxygen Delivery Method: Circle system utilized Preoxygenation: Pre-oxygenation with 100% oxygen Induction Type: IV induction LMA: LMA inserted LMA Size: 4.0 Number of attempts: 1 Placement Confirmation: positive ETCO2 and breath sounds checked- equal and bilateral Tube secured with: Tape Dental Injury: Teeth and Oropharynx as per pre-operative assessment

## 2019-05-24 NOTE — Op Note (Signed)
Breast Reduction Op note:    DATE OF PROCEDURE: 05/24/2019  LOCATION: [facility]  SURGEON: Alan Ripper Sanger Russell Engelstad, DO  ASSISTANT: Joni Fears, PA  PREOPERATIVE DIAGNOSIS 1. Macromastia 2. Neck Pain 3. Back Pain  POSTOPERATIVE DIAGNOSIS 1. Macromastia 2. Neck Pain 3. Back Pain  PROCEDURES 1. Bilateral breast reduction.  Right reduction 730 g, Left reduction 702 g  COMPLICATIONS: None.  DRAINS: none  INDICATIONS FOR PROCEDURE Brooke Cruz is a 49 y.o. year-old female born on [birth year],with a history of symptomatic macromastia with concominant back pain, neck pain, shoulder grooving from her bra.   MRN: [id number]  CONSENT Informed consent was obtained directly from the patient. The risks, benefits and alternatives were fully discussed. Specific risks including but not limited to bleeding, infection, hematoma, seroma, scarring, pain, nipple necrosis, asymmetry, poor cosmetic results, and need for further surgery were discussed. The patient had ample opportunity to have her questions answered to her satisfaction.  DESCRIPTION OF PROCEDURE  Patient was brought into the operating room and placed in a supine position.  SCDs were placed and appropriate padding was performed.  Antibiotics were given. The patient underwent general anesthesia and the chest was prepped and draped in a sterile fashion.  A timeout was performed and all information was confirmed to be correct.  Right side: Preoperative markings were confirmed.  Incision lines were injected with 1% Xylocaine with epinephrine.  After waiting for vasoconstriction, the marked lines were incised.  A Wise-pattern superomedial breast reduction was performed by de-epithelializing the pedicle, using bovie to create the superomedial pedicle, and removing breast tissue from the superior, lateral, and inferior portions of the breast.  Care was taken to not undermine the breast pedicle. Hemostasis was  achieved.  The nipple was gently rotated into position and the soft tissue closed with 4-0 Monocryl.   The pocket was irrigated and hemostasis confirmed.  The deep tissues were approximated with 3-0 monocryl sutures and the skin was closed with deep dermal and subcuticular 4-0 Monocryl sutures.  The nipple and skin flaps had good capillary refill at the end of the procedure.    Left side: Preoperative markings were confirmed.  Incision lines were injected with 1% Xylocaine with epinephrine.  After waiting for vasoconstriction, the marked lines were incised.  A Wise-pattern superomedial breast reduction was performed by de-epithelializing the pedicle, using bovie to create the superomedial pedicle, and removing breast tissue from the superior, lateral, and inferior portions of the breast.  Care was taken to not undermine the breast pedicle. Hemostasis was achieved.  The nipple was gently rotated into position and the soft tissue was closed with 4-0 Monocryl.  The patient was sat upright and size and shape symmetry was confirmed.  The pocket was irrigated and hemostasis confirmed.  The deep tissues were approximated with 3-0 monocryl sutures and the skin was closed with deep dermal and subcuticular 4-0 Monocryl sutures.  Dermabond was applied.  A breast binder and ABDs were placed.  The nipple and skin flaps had good capillary refill at the end of the procedure.  The patient tolerated the procedure well. The patient was allowed to wake from anesthesia and taken to the recovery room in satisfactory condition.  The advanced practice practitioner (APP) assisted throughout the case.  The APP was essential in retraction and counter traction when needed to make the case progress smoothly.  This retraction and assistance made it possible to see the tissue plans for the procedure.  The assistance was needed for  blood control, tissue re-approximation and assisted with closure of the incision site.  The [place] was signed into law in 2016 which includes the topic of electronic health records.  This provides immediate access to information in MyChart.  This includes consultation notes, operative notes, office notes, lab results and pathology reports.  If you have any questions about what you read please let us know at your next visit or call us at the office.  We are right here with you.

## 2019-05-24 NOTE — Discharge Instructions (Signed)
INSTRUCTIONS FOR AFTER BREAST SURGERY   You are getting ready to undergo breast surgery.  You will likely have some questions about what to expect following your operation.  The following information will help you and your family understand what to expect when you are discharged from the hospital.  Following these guidelines will help ensure a smooth recovery and reduce risks of complications.   Postoperative instructions include information on: diet, wound care, medications and physical activity.  AFTER SURGERY Expect to go home after the procedure.  In some cases, you may need to spend one night in the hospital for observation.  DIET Breast surgery does not require a specific diet.  However, the healthier you eat the better your body can start healing. It is important to increasing your protein intake.  This means limiting the foods with sugar and carbohydrates.  Focus on vegetables and some meat.  If you have any liposuction during your procedure be sure to drink water.  If your urine is bright yellow, then it is concentrated, and you need to drink more water.  As a general rule after surgery, you should have 8 ounces of water every hour while awake.  If you find you are persistently nauseated or unable to take in liquids let us know.  NO TOBACCO USE or EXPOSURE.  This will slow your healing process and increase the risk of a wound.  WOUND CARE If you don't have a drain:  You can shower the day after surgery. Use fragrance free soap.  Dial, [place] and [place] are usually mild on the skin. If you have a drain: You can shower five days after surgery.  Clean with baby wipes until the drain is removed.    If you have steri-strips / tape directly attached to your skin leave them in place. It is OK to get these wet.  No baths, pools or hot tubs for two weeks. We close your incision to leave the smallest and best-looking scar. No ointment or creams on your incisions until given the go ahead.  Especially not  Neosporin (Too many skin reactions with this one).  A few weeks after surgery you can use Mederma and start massaging the scar. We ask you to wear your binder or sports bra for the first 6 weeks around the clock, including while sleeping. This provides added comfort and helps reduce the fluid accumulation at the surgery site.  ACTIVITY No heavy lifting until cleared by the doctor.  This usually means no more than a half-gallon of milk.  It is OK to walk and climb stairs. In fact, moving your legs is very important to decrease your risk of a blood clot.  It will also help keep you from getting deconditioned.  Every 1 to 2 hours get up and walk for 5 minutes. This will help with a quicker recovery back to normal.  Let pain be your guide so you don't do too much.  This is not the time for spring cleaning and don't plan on taking care of anyone else.  This time is for you to recover,  You will be more comfortable if you sleep and rest with your head elevated either with a few pillows under you or in a recliner.  No stomach sleeping for a three months.  WORK Everyone returns to work at different times. As a rough guide, most people take at least 1 - 2 weeks off prior to returning to work. If you need documentation for your job,  bring the forms to your postoperative follow up visit.  DRIVING Arrange for someone to bring you home from the hospital.  You may be able to drive a few days after surgery but not while taking any narcotics or valium.  BOWEL MOVEMENTS Constipation can occur after anesthesia and while taking pain medication.  It is important to stay ahead for your comfort.  We recommend taking Milk of Magnesia (2 tablespoons; twice a day) while taking the pain pills.  SEROMA This is fluid your body tried to put in the surgical site.  This is normal but if it creates tight skinny skin let us know.  It usually decreases in a few weeks.  MEDICATIONS and PAIN CONTROL At your preoperative visit for  you history and physical you were given the following medications: 1. An antibiotic: Start this medication when you get home and take according to the instructions on the bottle. 2. Zofran 4 mg:  This is to treat nausea and vomiting.  You can take this every 6 hours as needed and only if needed. 3. Valium 2 mg: This is for muscle tightness if you have an implant or expander. This will help relax your muscle which also helps with pain control.  This can be taken every 12 hours as needed.  Don't drive after taking this medication. 4. Norco (hydrocodone/acetaminophen) 5/325 mg:  This is only to be used after you have taken the motrin or the tylenol. Every 8 hours as needed. Over the counter Medication to take: 5. Ibuprofen (Motrin) 600 mg:  Take this every 6 hours.  If you have additional pain then take 500 mg of the tylenol every 8 hours.  Only take the Norco after you have tried these two. 6. Miralax or stool softener of choice: Take this according to the bottle if you take the [place] Call your surgeon's office if any of the following occur: . Fever 101 degrees F or greater . Excessive bleeding or fluid from the incision site. . Pain that increases over time without aid from the medications . Redness, warmth, or pus draining from incision sites . Persistent nausea or inability to take in liquids . Severe misshapen area that underwent the operation.    Post Anesthesia Home Care Instructions  Activity: Get plenty of rest for the remainder of the day. A responsible individual must stay with you for 24 hours following the procedure.  For the next 24 hours, DO NOT: -Drive a car -Paediatric nurse -Drink alcoholic beverages -Take any medication unless instructed by your physician -Make any legal decisions or sign important papers.  Meals: Start with liquid foods such as gelatin or soup. Progress to regular foods as tolerated. Avoid greasy, spicy, heavy foods. If nausea and/or  vomiting occur, drink only clear liquids until the nausea and/or vomiting subsides. Call your physician if vomiting continues.  Special Instructions/Symptoms: Your throat may feel dry or sore from the anesthesia or the breathing tube placed in your throat during surgery. If this causes discomfort, gargle with warm salt water. The discomfort should disappear within 24 hours.  If you had a scopolamine patch placed behind your ear for the management of post- operative nausea and/or vomiting:  1. The medication in the patch is effective for 72 hours, after which it should be removed.  Wrap patch in a tissue and discard in the trash. Wash hands thoroughly with soap and water. 2. You may remove the patch earlier than 72 hours if you experience  unpleasant side effects which may include dry mouth, dizziness or visual disturbances. 3. Avoid touching the patch. Wash your hands with soap and water after contact with the patch.

## 2019-05-24 NOTE — Transfer of Care (Signed)
Immediate Anesthesia Transfer of Care Note  Patient: Brooke Cruz  Procedure(s) Performed: mammary reduction (Bilateral Breast)  Patient Location: PACU  Anesthesia Type:General  Level of Consciousness: drowsy  Airway & Oxygen Therapy: Patient Spontanous Breathing and Patient connected to nasal cannula oxygen  Post-op Assessment: Report given to RN and Post -op Vital signs reviewed and stable  Post vital signs: Reviewed and stable  Last Vitals:  Vitals Value Taken Time  BP 119/73 05/24/19 1254  Temp    Pulse 98 05/24/19 1258  Resp 18 05/24/19 1258  SpO2 98 % 05/24/19 1258  Vitals shown include unvalidated device data.  Last Pain:  Vitals:   05/24/19 0814  TempSrc: Oral  PainSc: 3          Complications: No apparent anesthesia complications

## 2019-05-24 NOTE — Anesthesia Preprocedure Evaluation (Signed)
Anesthesia Evaluation  Patient identified by MRN, date of birth, ID band Patient awake    Reviewed: Allergy & Precautions, NPO status , Patient's Chart, lab work & pertinent test results  Airway Mallampati: II  TM Distance: >3 FB Neck ROM: Full    Dental no notable dental hx.    Pulmonary asthma ,    Pulmonary exam normal breath sounds clear to auscultation       Cardiovascular hypertension, Pt. on medications negative cardio ROS Normal cardiovascular exam Rhythm:Regular Rate:Normal     Neuro/Psych  Headaches, Anxiety Depression negative psych ROS   GI/Hepatic Neg liver ROS, GERD  ,  Endo/Other  negative endocrine ROS  Renal/GU negative Renal ROS  negative genitourinary   Musculoskeletal  (+) Arthritis ,   Abdominal (+) + obese,   Peds negative pediatric ROS (+)  Hematology negative hematology ROS (+)   Anesthesia Other Findings   Reproductive/Obstetrics negative OB ROS                             Anesthesia Physical Anesthesia Plan  ASA: II  Anesthesia Plan: General   Post-op Pain Management:    Induction: Intravenous  PONV Risk Score and Plan: 3 and Ondansetron, Dexamethasone, Midazolam and Treatment may vary due to age or medical condition  Airway Management Planned: LMA  Additional Equipment:   Intra-op Plan:   Post-operative Plan: Extubation in OR  Informed Consent: I have reviewed the patients History and Physical, chart, labs and discussed the procedure including the risks, benefits and alternatives for the proposed anesthesia with the patient or authorized representative who has indicated his/her understanding and acceptance.     Dental advisory given  Plan Discussed with: CRNA  Anesthesia Plan Comments:         Anesthesia Quick Evaluation

## 2019-05-25 ENCOUNTER — Encounter: Payer: Self-pay | Admitting: *Deleted

## 2019-05-28 LAB — SURGICAL PATHOLOGY

## 2019-06-01 ENCOUNTER — Ambulatory Visit (INDEPENDENT_AMBULATORY_CARE_PROVIDER_SITE_OTHER): Payer: Medicare Other | Admitting: Plastic Surgery

## 2019-06-01 ENCOUNTER — Encounter: Payer: Self-pay | Admitting: Plastic Surgery

## 2019-06-01 ENCOUNTER — Other Ambulatory Visit: Payer: Self-pay

## 2019-06-01 VITALS — BP 144/77 | HR 86 | Temp 98.2°F | Ht 62.0 in | Wt 170.4 lb

## 2019-06-01 DIAGNOSIS — N62 Hypertrophy of breast: Secondary | ICD-10-CM

## 2019-06-01 NOTE — Progress Notes (Signed)
The patient is a 49 year old female here for follow-up on her bilateral breast reduction from February 18.  She has a little bit of bruising on the right vertical limb and a 2 mm opening at the vertical limb on the left side.  There is no sign of infection.  There does not appear to be any hematoma or seroma.  The bruising is swelling is as expected.  She is very pleased with the results.  She had over 700 g removed from both breasts.  She is planning on a trip and she will see [place] as soon as she gets back.  That will be around 2 to 4 weeks she can go into a sports bra.  Shower like usual.  Vaseline to bruised area.

## 2019-06-04 ENCOUNTER — Other Ambulatory Visit: Payer: Self-pay | Admitting: Surgical

## 2019-06-04 MED ORDER — CEPHALEXIN 500 MG PO CAPS: 500.0000 mg | ORAL_CAPSULE | Freq: Four times a day (QID) | ORAL | 0 refills | Status: AC

## 2019-06-15 ENCOUNTER — Encounter: Payer: Medicare Other | Admitting: Plastic Surgery

## 2019-06-18 NOTE — Progress Notes (Signed)
Patient is a 49 year old female here for follow-up after her bilateral breast reduction on 05/24/2019 with Dr. Ulice Bold. (> 700 g removed from both breasts)  She is ~3.5 weeks post op.  Bruising of the right vertical limb has resolved from her last visit.  She has approximately 2 mm openings at the vertical limb in the IMF bilaterally.  Both are healing with thin tissue coverings over them.  No signs of infection, redness, drainage, seroma/hematoma.  Bilateral incisions are healing very well, C/D/I.  Some suture knots were removed today for patient comfort.  Patient denies fever, chills, N/V, chest pain, shortness of breath, or breast pain.  Do daily dressing changes at vertical limb incision openings consisting of Vaseline and gauze.  Continue wearing sports bra or binder 24/7.  Continue to limit activity.  Patient is going on a trip to [place] the first week in April. Follow-up in 2 weeks.  Call office with any questions/concerns or if condition worsens.  The 21st Century Cures Act was signed into law in 2016 which includes the topic of electronic health records.  This provides immediate access to information in MyChart.  This includes consultation notes, operative notes, office notes, lab results and pathology reports.  If you have any questions about what you read please let us know at your next visit or call us at the office.  We are right here with you.

## 2019-06-19 ENCOUNTER — Ambulatory Visit (INDEPENDENT_AMBULATORY_CARE_PROVIDER_SITE_OTHER): Payer: Medicare Other | Admitting: Plastic Surgery

## 2019-06-19 ENCOUNTER — Other Ambulatory Visit: Payer: Self-pay

## 2019-06-19 ENCOUNTER — Encounter: Payer: Self-pay | Admitting: Plastic Surgery

## 2019-06-19 DIAGNOSIS — Z9889 Other specified postprocedural states: Secondary | ICD-10-CM | POA: Insufficient documentation

## 2019-06-26 ENCOUNTER — Encounter: Payer: Self-pay | Admitting: Plastic Surgery

## 2019-06-27 ENCOUNTER — Other Ambulatory Visit (INDEPENDENT_AMBULATORY_CARE_PROVIDER_SITE_OTHER): Payer: Medicare Other | Admitting: Plastic Surgery

## 2019-06-27 ENCOUNTER — Telehealth: Payer: Self-pay | Admitting: Plastic Surgery

## 2019-06-27 DIAGNOSIS — Z9889 Other specified postprocedural states: Secondary | ICD-10-CM

## 2019-06-27 MED ORDER — CLOTRIMAZOLE 1 % EX CREA: 1.0000 "application " | TOPICAL_CREAM | Freq: Two times a day (BID) | CUTANEOUS | 0 refills | Status: AC

## 2019-06-27 NOTE — Telephone Encounter (Signed)
Per Johanna-Spoke with patient. She feels strongly it's yeast and asked for antifungal to be called in. Rx sent

## 2019-06-27 NOTE — Telephone Encounter (Signed)
Pt called to say that she has rash on her breasts and she sent a mychart message and hasn't heard back. She said it is very bothersome and she wanted to know if she needed to come in sooner than next Wednesday or if something could be called in. Please call her to advise.

## 2019-07-02 NOTE — Progress Notes (Signed)
Patient is a 49 year old female here for follow-up after her bilateral breast reduction on 05/24/2019 with Dr. Ulice Bold.  (Greater than 700 g removed from each breast)  ~ 5.5 weks post op. Incisions are healing well, c/d/i. Small area of right vertical limb is close to being fully healed from previous opening  (0.5x0.5 cm). She has a rash on her breasts bilaterally. Sent in Rx for triamcinolone. Avoid applying directly to incisions. Keep breasts dry.    She leaves for [facility] tomorrow for vacation.  Avoid submerging breasts in any water. May wear sports bra only during the day now. May resume normal activity as tolerated.  Follow up in 6 weeks.  Call office with any questions/concerns or if rash does not clear up by the time she returns from vacation. Pictures were obtained of the patient and placed in the chart with the patient's or guardian's permission.  The 21st Century Cures Act was signed into law in 2016 which includes the topic of electronic health records.  This provides immediate access to information in MyChart.  This includes consultation notes, operative notes, office notes, lab results and pathology reports.  If you have any questions about what you read please let us know at your next visit or call us at the office.  We are right here with you.

## 2019-07-03 ENCOUNTER — Encounter: Payer: Self-pay | Admitting: Plastic Surgery

## 2019-07-03 ENCOUNTER — Other Ambulatory Visit: Payer: Self-pay

## 2019-07-03 ENCOUNTER — Ambulatory Visit (INDEPENDENT_AMBULATORY_CARE_PROVIDER_SITE_OTHER): Payer: Medicare Other | Admitting: Plastic Surgery

## 2019-07-03 VITALS — BP 135/93 | HR 90 | Temp 97.8°F | Ht 62.0 in | Wt 169.0 lb

## 2019-07-03 DIAGNOSIS — Z9889 Other specified postprocedural states: Secondary | ICD-10-CM

## 2019-07-03 MED ORDER — TRIAMCINOLONE ACETONIDE 0.1 % EX CREA: 1.0000 "application " | TOPICAL_CREAM | Freq: Two times a day (BID) | CUTANEOUS | 0 refills | Status: AC

## 2019-07-10 ENCOUNTER — Telehealth: Payer: Self-pay | Admitting: Plastic Surgery

## 2019-07-10 NOTE — Telephone Encounter (Signed)
Return patients call. LM on VM questioning which breast she is having problems, any drainage, fever, chills or vomiting. May can get her in this Friday with [facility].

## 2019-07-10 NOTE — Telephone Encounter (Signed)
Patient called to advise that she is having some breast pain on one side. It's swollen, hard, warm but not red. Please call patient to advise what she should do

## 2019-07-11 NOTE — Telephone Encounter (Signed)
Spoke with patient and she is not running a fever, drainage, nor red. Her breast is hard and swollen. Offered her to come in Friday as that is the earliest available.

## 2019-07-12 NOTE — Progress Notes (Signed)
Patient is a 49 year old female here for follow-up due to swelling and firmness of right breast.  She had bilateral breast reduction on 05/24/2019 with Dr. Ulice Bold.  ( > 700 g removed from each breast).  She had an open wound on the right vertical limb that had been improving and at last visit was approximately 0.5 x 0.5 cm.  She also had a rash on her breast bilaterally and was given triamcinolone.  She went on vacation to [place] on 3/31.  ~ 7 weeks PO  Today she could presents complaining of pain and firmness of the right breast.  Inferior and lower lateral portions of the right breast are very firm and tender to palpation. Some swelling apparant. Mild redness. No signs of drainage.  Wound on right vertical limb/IMF has closed and is healing well.  Bilateral breast rash is greatly improved.  Denies fever, CP, SOB, N/V.  Reports some muscle aches since receiving her Covid vaccine yesterday.  Rx sent to pharmacy for 5 days doxycycline.  Follow-up on Wednesday.  Call office with any questions/concerns or if condition worsens.  Pictures were obtained of the patient and placed in the chart with the patient's or guardian's permission.  The 21st Century Cures Act was signed into law in 2016 which includes the topic of electronic health records.  This provides immediate access to information in MyChart.  This includes consultation notes, operative notes, office notes, lab results and pathology reports.  If you have any questions about what you read please let us know at your next visit or call us at the office.  We are right here with you.

## 2019-07-13 ENCOUNTER — Ambulatory Visit: Payer: Medicare Other | Admitting: Plastic Surgery

## 2019-07-13 ENCOUNTER — Ambulatory Visit (INDEPENDENT_AMBULATORY_CARE_PROVIDER_SITE_OTHER): Payer: Medicare Other | Admitting: Plastic Surgery

## 2019-07-13 ENCOUNTER — Other Ambulatory Visit: Payer: Self-pay

## 2019-07-13 ENCOUNTER — Encounter: Payer: Self-pay | Admitting: Plastic Surgery

## 2019-07-13 VITALS — BP 133/85 | HR 94 | Temp 98.6°F | Ht 62.0 in | Wt 171.0 lb

## 2019-07-13 DIAGNOSIS — Z9889 Other specified postprocedural states: Secondary | ICD-10-CM

## 2019-07-13 MED ORDER — DOXYCYCLINE HYCLATE 100 MG PO TABS: 100.0000 mg | ORAL_TABLET | Freq: Two times a day (BID) | ORAL | 0 refills | Status: AC

## 2019-07-16 ENCOUNTER — Telehealth: Payer: Self-pay | Admitting: Plastic Surgery

## 2019-07-16 NOTE — Telephone Encounter (Signed)
Spoke with patient she'd like to go ahead and come in tomorrow. Appointment scheduled.

## 2019-07-16 NOTE — Telephone Encounter (Signed)
Patient called to say she had started to take antibiotic that was prescribed but it actually got worse over the weekend and it's swollen. She wanted to know if she should come in earlier than Wednesday. Please call her to advise.

## 2019-07-17 ENCOUNTER — Encounter: Payer: Self-pay | Admitting: Surgical

## 2019-07-17 ENCOUNTER — Other Ambulatory Visit: Payer: Self-pay

## 2019-07-17 ENCOUNTER — Ambulatory Visit (INDEPENDENT_AMBULATORY_CARE_PROVIDER_SITE_OTHER): Payer: Medicare Other | Admitting: Surgical

## 2019-07-17 VITALS — BP 129/86 | HR 84 | Temp 97.9°F | Ht 62.0 in | Wt 170.0 lb

## 2019-07-17 DIAGNOSIS — Z9889 Other specified postprocedural states: Secondary | ICD-10-CM

## 2019-07-17 MED ORDER — DOXYCYCLINE HYCLATE 100 MG PO TABS: 100.0000 mg | ORAL_TABLET | Freq: Two times a day (BID) | ORAL | 0 refills | Status: AC

## 2019-07-17 NOTE — Progress Notes (Signed)
The patient is a 49 year old female here for follow-up after bilateral breast reduction on 05/24/2019 by Dr. Ulice Bold. Patient was last seen on 07/13/2019 presented with complaints of pain and firmness of the right breast.  Patient was sent in 5 days of doxycycline, she has been taking this daily.  Patient reports that she does not feel like she is doing much better.  She has not had any fevers, vomiting, but does endorse some chills at night.  She reports a lot of right breast swelling that intermittently gets worse.  Pain in her right breast.  She does not have any issues with left breast.  BP 129/86 (BP Location: Left Arm, Patient Position: Sitting, Cuff Size: Large)   Pulse 84   Temp 97.9 F (36.6 C) (Temporal)   Ht 5\' 2"  (1.575 m)   Wt 170 lb (77.1 kg)   SpO2 96%   BMI 31.09 kg/m  Chaperone present  On exam patient is in no acute distress, tearful. She has tenderness to her right breast, some mild erythema medially.  I am able to palpate scar tissue/fat necrosis along the right vertical limb and inframammary fold laterally.  She does seem to have some thickened scar tissue along the right lateral inframammary fold, this is tender to palpation. Her breast incisions have healed really nicely, all are C/D/I, no drainage noted.  No fluid wave noted.  No foul odor noted.  No fluctuance noted.  I did review photos from her last appointment on 07/13/2019, it does not appear worse.  There might be some slightly increased erythema medially, but it is difficult to tell after manipulation.  There is no cellulitic changes.  Plan:  I do not note any overt signs of infection on exam today, no overt fluid wave noted, no drainage or foul odor noted.  No wounds noted.  Due to patient's pain, will have ultrasound-guided aspiration ordered to evaluate for right breast seroma and possibly drain as indicated.  Dr. 09/12/2019 and I were both present on exam.  Dr. Ulice Bold in agreement with plan as well as  patient.  Additional prescription for Doxy sent x10 days. Telemetry visit scheduled for Thursday to follow-up An office visit scheduled for Friday for follow-up.   Recommend calling with any questions or concerns, worsening symptoms, fevers, chills, nausea, vomiting.  Patient in agreement with current plan.

## 2019-07-18 ENCOUNTER — Ambulatory Visit: Payer: Medicaid Other | Admitting: Plastic Surgery

## 2019-07-19 ENCOUNTER — Other Ambulatory Visit: Payer: Self-pay

## 2019-07-19 ENCOUNTER — Telehealth: Payer: Medicare Other | Admitting: Plastic Surgery

## 2019-07-19 ENCOUNTER — Other Ambulatory Visit: Payer: Self-pay | Admitting: Surgical

## 2019-07-19 DIAGNOSIS — Z9889 Other specified postprocedural states: Secondary | ICD-10-CM

## 2019-07-23 ENCOUNTER — Telehealth: Payer: Self-pay | Admitting: Plastic Surgery

## 2019-07-23 NOTE — Telephone Encounter (Signed)
Patient lvm wanting to see if she still needed to do her Ultrasound that was scheduled for in the morning. Sounded like she might be feeling better and wanted to know if it was still necessary. Please call patient to discuss.

## 2019-07-23 NOTE — Telephone Encounter (Signed)
Called and spoke with patient this afternoon at 1720.  She reports that redness of her right breast has improved and she feels as if it is no longer red.  She also noted that the swelling has slightly improved.   She would like to wait and see if ultrasound is necessary due to cost.  I advised patient that our recommendation was to have the ultrasound for further evaluation, but we can always wait and reorder ultrasound if her symptoms worsen.    I offered the patient an appointment tomorrow for 9am to further evaluate.  She is currently on doxycycline, prescribed by me approximately 1 week ago.

## 2019-07-23 NOTE — Telephone Encounter (Signed)
Spoke briefly with [facility] who advised patient still needs to get Ultrasound. Called patient back to advise. She said she cannot afford the ultrasound for just having scar tissue looked at and wanted me to follow up. Please call to advise further.

## 2019-07-24 ENCOUNTER — Ambulatory Visit: Payer: Medicaid Other

## 2019-07-24 ENCOUNTER — Ambulatory Visit (INDEPENDENT_AMBULATORY_CARE_PROVIDER_SITE_OTHER): Payer: Medicare Other | Admitting: Surgical

## 2019-07-24 ENCOUNTER — Encounter: Payer: Self-pay | Admitting: Surgical

## 2019-07-24 VITALS — BP 108/86 | HR 103 | Temp 98.1°F | Wt 170.0 lb

## 2019-07-24 DIAGNOSIS — Z9889 Other specified postprocedural states: Secondary | ICD-10-CM

## 2019-07-24 DIAGNOSIS — M546 Pain in thoracic spine: Secondary | ICD-10-CM

## 2019-07-24 DIAGNOSIS — M542 Cervicalgia: Secondary | ICD-10-CM

## 2019-07-24 DIAGNOSIS — G8929 Other chronic pain: Secondary | ICD-10-CM

## 2019-07-24 DIAGNOSIS — N62 Hypertrophy of breast: Secondary | ICD-10-CM

## 2019-07-24 DIAGNOSIS — N644 Mastodynia: Secondary | ICD-10-CM

## 2019-07-24 NOTE — Progress Notes (Signed)
Patient is a 49 year old female here for follow-up after bilateral breast reduction on 05/24/2019 with Dr. Ulice Bold.  730 g removed from right breast, 702 g removed from left breast.  She is approximately 9 weeks postop.  Patient reports that redness of right breast has improved since last visit, she continues to have right breast pain near the inframammary fold medially and the vertical limb.  She is also concerned about scarring causing distortion of right lateral inframammary fold incision.  Patient previously scheduled to have ultrasound of right breast today to evaluate for any fluid collection, patient opted to wait due to right breast improving with prescription of doxycycline.  She has a few more days of doxycycline.  She has not had any fevers, chills, nausea or vomiting. No sign of infection, seroma, hematoma  Chaperone present on exam. On exam bilateral breasts with well-healed incisions.  Right breast with tenderness to palpation along lateral inframammary fold incision.  There is some scarring/fat necrosis of the lateral breast near the vertical limb incision as well as the inframammary fold incision.  No open wounds at this time.  Right breast erythema significantly improved and has resolved.  Good NAC color, good cap refill.  Left breast with some mild fat necrosis immediately lateral to vertical limb incision.  Incisions are C/D/I.  No erythema.  No fluid wave noted.  No tenderness to palpation.  Good NAC color.  Good cap refill.  No open wounds noted.  Plan:  Ms. Brooke Cruz is a 49 year old female here for follow-up after bilateral breast reduction, incisions are healing nicely, no open wounds noted.  No sign of any seroma, hematoma, infection at this time.  Erythema from previous visit has significantly improved and is no longer noticeable.  She continues to have some fat necrosis/scarring of the right breast  Recommend routine massage to right breast fat necrosis/scarring.   Patient reports that this has been difficult due to tenderness with massaging.  I recommended beginning to massage the areas that were less tender with a scar cream like Mederma or unscented lotion.  If she develops any wounds, avoid placing any scar cream or lotion on the opening.  Recommend finishing prescription of doxycycline, calling if redness returns.  Follow-up in 1 month for reevaluation.

## 2019-08-06 ENCOUNTER — Other Ambulatory Visit: Payer: Medicaid Other

## 2019-08-12 NOTE — Progress Notes (Deleted)
Patient is a 49 year old female here for follow-up after undergoing bilateral breast reduction on 05/24/2019 with Dr. Ulice Bold. (right: 730 g, Left: 702 g)  ~ 12 weeks PO  Photos!

## 2019-08-15 ENCOUNTER — Ambulatory Visit: Payer: Medicare Other | Admitting: Plastic Surgery

## 2019-08-28 ENCOUNTER — Other Ambulatory Visit: Payer: Self-pay

## 2019-08-28 ENCOUNTER — Encounter: Payer: Self-pay | Admitting: Plastic Surgery

## 2019-08-28 ENCOUNTER — Ambulatory Visit (INDEPENDENT_AMBULATORY_CARE_PROVIDER_SITE_OTHER): Payer: Medicare Other | Admitting: Plastic Surgery

## 2019-08-28 VITALS — BP 131/84 | HR 102 | Temp 97.7°F | Ht 62.0 in | Wt 174.6 lb

## 2019-08-28 DIAGNOSIS — Z9889 Other specified postprocedural states: Secondary | ICD-10-CM

## 2019-08-28 NOTE — Progress Notes (Addendum)
   Subjective:    Patient ID: Brooke Cruz, female    DOB: 1970/07/31, 49 y.o.   MRN: [id number]  The patient is a 49 year old female here for follow-up after undergoing a bilateral breast reduction.  Her surgery was in February.  Since then she has noted several areas of firmness that is fat necrosis.  The left breast has not seemed to change at all.  The right breast seems to be getting a little bit better.  She does about once a day massage.  There is no sign of infection and all incisions are nicely healed.  She is very pleased with her shape and contour as well as size.  She also had what looked like a rash on the areola of the left breast.     Review of Systems  Constitutional: Negative.   HENT: Negative.   Eyes: Negative.   Respiratory: Negative.   Cardiovascular: Negative.   Gastrointestinal: Negative.   Genitourinary: Negative.   Musculoskeletal: Negative.        Objective:   Physical Exam Vitals and nursing note reviewed.  Constitutional:      Appearance: Normal appearance.  HENT:     Head: Normocephalic and atraumatic.  Cardiovascular:     Rate and Rhythm: Normal rate.     Pulses: Normal pulses.  Pulmonary:     Effort: Pulmonary effort is normal.  Neurological:     General: No focal deficit present.     Mental Status: She is alert and oriented to person, place, and time.  Psychiatric:        Mood and Affect: Mood normal.        Behavior: Behavior normal.        Assessment & Plan:     ICD-10-CM   1. S/P bilateral breast reduction  Z98.890     We discussed increasing the frequency of massaging to be nearly every hour on the hour just for a few minutes to help see if we can break up some of the fat necrosis.  If not surgery is certainly is an option both direct excision and breaking up of the fat necrosis.  The risk is that this could possibly create contour irregularities.  We have agreed to increase the massaging and have her follow-up in a few months  and let see if there is any improvement.  We would also like to see if physical therapy can provide any assistance to help break up the fat necrosis.  I recommend the hydrocortisone cream from the left areola.  Patient has at home so I will send it into the pharmacy.  Pictures were obtained of the patient and placed in the chart with the patient's or guardian's permission.

## 2019-10-04 ENCOUNTER — Ambulatory Visit: Payer: Medicare Other | Attending: Plastic Surgery | Admitting: Rehabilitative and Restorative Service Providers"

## 2019-10-04 DIAGNOSIS — M25511 Pain in right shoulder: Secondary | ICD-10-CM | POA: Insufficient documentation

## 2019-10-04 DIAGNOSIS — M546 Pain in thoracic spine: Secondary | ICD-10-CM | POA: Insufficient documentation

## 2019-10-04 DIAGNOSIS — G8929 Other chronic pain: Secondary | ICD-10-CM | POA: Insufficient documentation

## 2019-10-04 DIAGNOSIS — M542 Cervicalgia: Secondary | ICD-10-CM | POA: Insufficient documentation

## 2019-10-04 DIAGNOSIS — M25512 Pain in left shoulder: Secondary | ICD-10-CM | POA: Insufficient documentation

## 2019-10-04 DIAGNOSIS — M6281 Muscle weakness (generalized): Secondary | ICD-10-CM | POA: Insufficient documentation

## 2019-10-04 DIAGNOSIS — Z9889 Other specified postprocedural states: Secondary | ICD-10-CM | POA: Insufficient documentation

## 2019-10-22 ENCOUNTER — Ambulatory Visit: Payer: Medicare Other

## 2019-10-22 ENCOUNTER — Other Ambulatory Visit: Payer: Self-pay

## 2019-10-22 DIAGNOSIS — Z9889 Other specified postprocedural states: Secondary | ICD-10-CM | POA: Diagnosis not present

## 2019-10-22 DIAGNOSIS — G8929 Other chronic pain: Secondary | ICD-10-CM | POA: Diagnosis present

## 2019-10-22 DIAGNOSIS — M6281 Muscle weakness (generalized): Secondary | ICD-10-CM | POA: Diagnosis present

## 2019-10-22 DIAGNOSIS — M542 Cervicalgia: Secondary | ICD-10-CM

## 2019-10-22 DIAGNOSIS — M546 Pain in thoracic spine: Secondary | ICD-10-CM | POA: Diagnosis present

## 2019-10-22 DIAGNOSIS — M25512 Pain in left shoulder: Secondary | ICD-10-CM

## 2019-10-22 DIAGNOSIS — M25511 Pain in right shoulder: Secondary | ICD-10-CM

## 2019-10-22 NOTE — Therapy (Signed)
[facility] [address] [place], [place], [address] Phone: [phone]   Fax:  [phone]  Physical Therapy Evaluation  Patient Details  Name: Brooke ConchVictoria L Cruz MRN: [id number] Date of Birth: 1971-03-08 Referring Provider (PT): Foster Simpsonlaire Dillingham, DO   Encounter Date: 10/22/2019   PT End of Session - 10/22/19 1105    Visit Number 1    Number of Visits 8    Date for PT Re-Evaluation 11/19/19    Authorization Type Medicare Part A and B    Progress Note Due on Visit 10    PT Start Time 1101    PT Stop Time 1152    PT Time Calculation (min) 51 min    Activity Tolerance Patient tolerated treatment well    Behavior During Therapy Flat affect           Past Medical History:  Diagnosis Date  . Anxiety and depression   . Arthritis   . Asthma    exercise induced  . Chronic back pain   . Depression   . Diverticulitis   . GERD (gastroesophageal reflux disease)   . Hyperlipemia   . Hypertension   . Peripheral neuropathy   . PTSD (post-traumatic stress disorder)     Past Surgical History:  Procedure Laterality Date  . BREAST REDUCTION SURGERY Bilateral 05/24/2019   Procedure: mammary reduction;  Surgeon: Peggye Formillingham, Claire S, DO;  Location: [facility];  Service: Plastics;  Laterality: Bilateral;  3.5 hours  . CESAREAN SECTION     x 2  . NASAL SINUS SURGERY      There were no vitals filed for this visit.    Subjective Assessment - 10/22/19 1108    Subjective Pt reports long time neck, upper and low back pain, as well as B foot pain. 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 She reports her oldest boy was helping her with cooking and cleaning at home, as well as grocery shopping before he  went to college. She is very limited with shopping and IADLS at this point secondary to pain.    Pertinent History B Breast Reduction 05/2019    Limitations Sitting;Lifting;House hold activities;Reading    Patient Stated Goals To work through scar tissue    Currently in Pain? Yes    Pain Score 6     Pain Location Back    Pain Orientation Right;Left    Pain Descriptors / Indicators Aching;Tightness    Pain Type Chronic pain    Pain Onset More than a month ago    Pain Frequency Once a week    Aggravating Factors  sitting, bending forward, (walking and standing due to low back and B foot pain)    Pain Relieving Factors medication, stretching, cease to do what is painful    Multiple Pain Sites Yes    Pain Score 6    Pain Location Neck    Pain Orientation Left    Pain Descriptors / Indicators Aching;Tightness;Sharp    Pain Type Chronic pain    Pain Onset More than a month ago    Pain Frequency Several days a week              [facility] PT Assessment - 10/22/19 0001      Assessment  Medical Diagnosis S/P B Breast Reduction    Referring Provider (PT) Foster Simpson, DO    Onset Date/Surgical Date 05/24/19    Hand Dominance Right    Next MD Visit 6 weeks out    Prior Therapy Yes   Feb 2020 for low back     Precautions   Precautions None      Balance Screen   Has the patient fallen in the past 6 months Yes    How many times? 1x    Has the patient had a decrease in activity level because of a fear of falling?  Yes    Is the patient reluctant to leave their home because of a fear of falling?  Yes      Home Environment   Additional Comments 1st floor apartment      Prior Function   Vocation On disability    Leisure shopping      Observation/Other Assessments   Focus on Therapeutic Outcomes (FOTO)  61% limited      ROM / Strength   AROM / PROM / Strength AROM;Strength      AROM   AROM Assessment Site Shoulder;Cervical    Right/Left Shoulder Right;Left    Right  Shoulder Flexion 145 Degrees    Right Shoulder ABduction 120 Degrees    Right Shoulder Internal Rotation --   T12   Right Shoulder External Rotation --   T1   Left Shoulder Flexion 140 Degrees    Left Shoulder ABduction 85 Degrees    Left Shoulder Internal Rotation --   T12   Left Shoulder External Rotation --   C7   Cervical Flexion 40    Cervical Extension 20    Cervical - Right Side Bend 20    Cervical - Left Side Bend 15    Cervical - Right Rotation 45    Cervical - Left Rotation 45      Strength   Strength Assessment Site Shoulder    Right/Left Shoulder Right;Left    Right Shoulder Flexion 4/5    Right Shoulder ABduction 4/5    Right Shoulder Internal Rotation 4-/5    Right Shoulder External Rotation 3+/5    Left Shoulder Flexion 3/5   pain   Left Shoulder ABduction 3/5   pain   Left Shoulder Internal Rotation 3+/5    Left Shoulder External Rotation 4/5      Palpation   Spinal mobility 2/6 thoracic spine hypomobility    Palpation comment TTP B pectorals                      Objective measurements completed on examination: See above findings.               PT Education - 10/22/19 1200    Education Details HEP, POC, FOTO    Person(s) Educated Patient    Methods Handout;Verbal cues;Tactile cues;Demonstration;Explanation    Comprehension Verbalized understanding;Returned demonstration;Verbal cues required;Tactile cues required            PT Short Term Goals - 10/22/19 1209      PT SHORT TERM GOAL #1   Title Pt will be I and compliant with initial HEP.    Time 1    Period Weeks    Status New    Target Date 10/29/19      PT SHORT TERM GOAL #2   Title Pt will be able to walk for 5-10 minute at a time, to initiate an exercise routine.  Time 2    Period Weeks    Status New    Target Date 11/19/19      PT SHORT TERM GOAL #3   Title Pt will incr B cervical rotation to >/= 60 degees, in order to check blindspots when driving.     Baseline 45    Time 3    Period Weeks    Status New    Target Date 11/12/19             PT Long Term Goals - 10/22/19 1210      PT LONG TERM GOAL #1   Title Pt will increase B shoulder MMT to 4/5, in order to improve participation in IADLs.    Time 4    Period Weeks    Status New    Target Date 11/19/19      PT LONG TERM GOAL #2   Title Pt will reduce FOTO limitation to 52% diability or less to demonstrate a significant improvement in function.    Time 4    Period Weeks    Status New    Target Date 11/19/19      PT LONG TERM GOAL #3   Title Pt will report ability to go to grocery store and navigate at least half the store for increased participation in community activity.    Time 4    Period Weeks    Status New    Target Date 11/19/19                  Plan - 10/22/19 1118    Clinical Impression Statement Pt is a 49 yo female presenting to PT 5 mo following B breast reduction. Pt reports some improvement in pain, but is limited due to other chronic fibromyalgia related pain. She has decreased B shoulder ROM L>R with pain, neck hypomobility and decreased ROM with pain, and overall painful lack of mobility. L>R shoulder strength is decreased with manual muscle testing with empty end feels in all planes of motion. Pt was educated on prognosis, HEP, and POC. She verbalized understanding and consented to treatment. She will benefit from a skilled round of physical therapy 2x/week for 4 weeks to address previously listed impairments.    Personal Factors and Comorbidities Behavior Pattern;Comorbidity 1;Comorbidity 3+;Comorbidity 2;Past/Current Experience;Time since onset of injury/illness/exacerbation    Comorbidities Fibromyalgia, HLD, HTN    Examination-Activity Limitations Carry;Locomotion Level;Reach Overhead;Stand;Sit;Bend;Lift    Examination-Participation Restrictions Cleaning;Laundry;Community Activity;Shop;Interpersonal Relationship    Stability/Clinical Decision  Making Evolving/Moderate complexity    Clinical Decision Making Moderate    Rehab Potential Fair    PT Frequency 2x / week    PT Duration 4 weeks    PT Treatment/Interventions ADLs/Self Care Home Management;Cryotherapy;Spinal Manipulations;Joint Manipulations;Vasopneumatic Device;Passive range of motion;Dry needling;Taping;Scar mobilization;Manual techniques;Patient/family education;Therapeutic exercise;Ultrasound;Traction;Electrical Stimulation;Moist Heat;Neuromuscular re-education;Iontophoresis 4mg /ml Dexamethasone;Therapeutic activities;Functional mobility training    PT Next Visit Plan Review HEP, trial ultrasound, MT for pectoral extensibility, thoracic mobility, postural alignment and strengthening    PT Home Exercise Plan RWRXGEHE - Medbridge (see pt instructions for pics)    Consulted and Agree with Plan of Care Patient           Patient will benefit from skilled therapeutic intervention in order to improve the following deficits and impairments:  Hypomobility, Decreased mobility, Decreased activity tolerance, Decreased strength, Increased fascial restricitons, Impaired flexibility, Postural dysfunction, Pain, Improper body mechanics, Impaired perceived functional ability, Decreased scar mobility, Decreased range of motion  Visit Diagnosis: Status post bilateral breast reduction -  Plan: PT plan of care cert/re-cert  Pain in thoracic spine - Plan: PT plan of care cert/re-cert  Cervical pain (neck) - Plan: PT plan of care cert/re-cert  Chronic left shoulder pain - Plan: PT plan of care cert/re-cert  Chronic right shoulder pain - Plan: PT plan of care cert/re-cert  Muscle weakness (generalized) - Plan: PT plan of care cert/re-cert     Problem List Patient Active Problem List   Diagnosis Date Noted  . S/P bilateral breast reduction 06/19/2019  . Breast pain 04/24/2019  . Migraine 04/04/2018  . Back pain 04/04/2018  . Neck pain 04/04/2018  . Symptomatic mammary hypertrophy  04/04/2018  . Small fiber neuropathy 01/10/2017  . Neuropathy 10/20/2016  . Anxiety 10/12/2016  . Chronic left-sided low back pain 04/14/2016  . Paresthesia 02/09/2016  . Blurred vision 02/09/2016  . Neck pain on right side 02/09/2016  . Major depressive disorder, recurrent episode, moderate (HCC) 05/27/2015  . Diverticulitis 12/27/2014  . Myalgia 11/25/2014  . Hyperlipidemia 07/26/2014  . Hypertension 05/31/2014  . Exercise-induced asthma 02/21/2014  . GERD (gastroesophageal reflux disease) 02/21/2014  . Depression 02/20/2014    Marcelline Mates, PT, DPT 10/22/2019, 12:19 PM  [facility] [address] [place], [place], [address] Phone: [phone]   Fax:  [phone]  Name: DANEEN VOLCY MRN: [id number] Date of Birth: 10/10/1970

## 2019-10-23 ENCOUNTER — Ambulatory Visit: Payer: Medicaid Other | Admitting: Plastic Surgery

## 2019-11-02 ENCOUNTER — Other Ambulatory Visit: Payer: Self-pay

## 2019-11-02 ENCOUNTER — Ambulatory Visit: Payer: Medicare Other | Admitting: Physical Therapy

## 2019-11-02 ENCOUNTER — Encounter: Payer: Self-pay | Admitting: Physical Therapy

## 2019-11-02 DIAGNOSIS — Z9889 Other specified postprocedural states: Secondary | ICD-10-CM | POA: Diagnosis not present

## 2019-11-02 DIAGNOSIS — M542 Cervicalgia: Secondary | ICD-10-CM

## 2019-11-02 DIAGNOSIS — M546 Pain in thoracic spine: Secondary | ICD-10-CM

## 2019-11-02 NOTE — Therapy (Signed)
[facility] [address] [place], [place], [address] Phone: [phone]   Fax:  [phone]  Physical Therapy Treatment  Patient Details  Name: Brooke Cruz MRN: [id number] Date of Birth: 23-Aug-1970 Referring Provider (PT): Foster Simpson, DO   Encounter Date: 11/02/2019   PT End of Session - 11/02/19 0855    Visit Number 2    Number of Visits 8    Date for PT Re-Evaluation 11/19/19    Authorization Type Medicare Part A and B    PT Start Time 0854   pt arrived late   PT Stop Time 0921    PT Time Calculation (min) 27 min    Activity Tolerance Patient tolerated treatment well    Behavior During Therapy [facility] for tasks assessed/performed           Past Medical History:  Diagnosis Date  . Anxiety and depression   . Arthritis   . Asthma    exercise induced  . Chronic back pain   . Depression   . Diverticulitis   . GERD (gastroesophageal reflux disease)   . Hyperlipemia   . Hypertension   . Peripheral neuropathy   . PTSD (post-traumatic stress disorder)     Past Surgical History:  Procedure Laterality Date  . BREAST REDUCTION SURGERY Bilateral 05/24/2019   Procedure: mammary reduction;  Surgeon: Peggye Form, DO;  Location: [facility];  Service: Plastics;  Laterality: Bilateral;  3.5 hours  . CESAREAN SECTION     x 2  . NASAL SINUS SURGERY      There were no vitals filed for this visit.   Subjective Assessment - 11/02/19 0856    Subjective Neck is not bothering me today. Low back is tight and a little bit painful on the left side, denies radicular symptoms.                             [facility] Adult PT Treatment/Exercise - 11/02/19 0001      Manual Therapy   Manual Therapy Joint mobilization;Soft tissue mobilization    Joint Mobilization prone Lt rib & Lt SIJ gr 4    Soft tissue mobilization to bilat breast tissue with stretching & movement of upper  extremities.                     PT Short Term Goals - 10/22/19 1209      PT SHORT TERM GOAL #1   Title Pt will be I and compliant with initial HEP.    Time 1    Period Weeks    Status New    Target Date 10/29/19      PT SHORT TERM GOAL #2   Title Pt will be able to walk for 5-10 minute at a time, to initiate an exercise routine.    Time 2    Period Weeks    Status New    Target Date 11/19/19      PT SHORT TERM GOAL #3   Title Pt will incr B cervical rotation to >/= 60 degees, in order to check blindspots when driving.    Baseline 45    Time 3    Period Weeks    Status New    Target Date 11/12/19             PT Long Term Goals - 10/22/19 1210      PT LONG TERM GOAL #  1   Title Pt will increase B shoulder MMT to 4/5, in order to improve participation in IADLs.    Time 4    Period Weeks    Status New    Target Date 11/19/19      PT LONG TERM GOAL #2   Title Pt will reduce FOTO limitation to 52% diability or less to demonstrate a significant improvement in function.    Time 4    Period Weeks    Status New    Target Date 11/19/19      PT LONG TERM GOAL #3   Title Pt will report ability to go to grocery store and navigate at least half the store for increased participation in community activity.    Time 4    Period Weeks    Status New    Target Date 11/19/19                 Plan - 11/02/19 9381    Clinical Impression Statement Focused on manual therapy today. Began with mobilization of Lt thoracic region and Lt SIJ, progressing to scar tissue mobilization in breast region. Advised that she should expect to be sore but to let me know if she becomes too sore.    PT Treatment/Interventions ADLs/Self Care Home Management;Cryotherapy;Spinal Manipulations;Joint Manipulations;Vasopneumatic Device;Passive range of motion;Dry needling;Taping;Scar mobilization;Manual techniques;Patient/family education;Therapeutic exercise;Ultrasound;Traction;Electrical  Stimulation;Moist Heat;Neuromuscular re-education;Iontophoresis 4mg /ml Dexamethasone;Therapeutic activities;Functional mobility training    PT Next Visit Plan continue breast tissue mobility, thoracic mobility/scapular coordination    PT Home Exercise Plan RWRXGEHE - Medbridge (see pt instructions for pics)    Consulted and Agree with Plan of Care Patient           Patient will benefit from skilled therapeutic intervention in order to improve the following deficits and impairments:  Hypomobility, Decreased mobility, Decreased activity tolerance, Decreased strength, Increased fascial restricitons, Impaired flexibility, Postural dysfunction, Pain, Improper body mechanics, Impaired perceived functional ability, Decreased scar mobility, Decreased range of motion  Visit Diagnosis: Status post bilateral breast reduction  Pain in thoracic spine  Cervical pain (neck)     Problem List Patient Active Problem List   Diagnosis Date Noted  . S/P bilateral breast reduction 06/19/2019  . Breast pain 04/24/2019  . Migraine 04/04/2018  . Back pain 04/04/2018  . Neck pain 04/04/2018  . Symptomatic mammary hypertrophy 04/04/2018  . Small fiber neuropathy 01/10/2017  . Neuropathy 10/20/2016  . Anxiety 10/12/2016  . Chronic left-sided low back pain 04/14/2016  . Paresthesia 02/09/2016  . Blurred vision 02/09/2016  . Neck pain on right side 02/09/2016  . Major depressive disorder, recurrent episode, moderate (HCC) 05/27/2015  . Diverticulitis 12/27/2014  . Myalgia 11/25/2014  . Hyperlipidemia 07/26/2014  . Hypertension 05/31/2014  . Exercise-induced asthma 02/21/2014  . GERD (gastroesophageal reflux disease) 02/21/2014  . Depression 02/20/2014    Carrin Vannostrand C. Marice Angelino PT, DPT 11/02/19 9:27 AM   [facility] [address] [place], [place], [place] Phone: [phone]   Fax:  [phone]  Name: [name] MRN:  Brooke Cruz Date of Birth: 1971-04-04

## 2019-11-06 ENCOUNTER — Encounter: Payer: Self-pay | Admitting: Physical Therapy

## 2019-11-06 ENCOUNTER — Ambulatory Visit: Payer: Medicare Other | Attending: Plastic Surgery | Admitting: Physical Therapy

## 2019-11-06 ENCOUNTER — Other Ambulatory Visit: Payer: Self-pay

## 2019-11-06 DIAGNOSIS — M545 Low back pain: Secondary | ICD-10-CM | POA: Insufficient documentation

## 2019-11-06 DIAGNOSIS — M546 Pain in thoracic spine: Secondary | ICD-10-CM | POA: Diagnosis present

## 2019-11-06 DIAGNOSIS — Z9889 Other specified postprocedural states: Secondary | ICD-10-CM | POA: Insufficient documentation

## 2019-11-06 DIAGNOSIS — M542 Cervicalgia: Secondary | ICD-10-CM | POA: Diagnosis present

## 2019-11-06 DIAGNOSIS — M25512 Pain in left shoulder: Secondary | ICD-10-CM | POA: Diagnosis present

## 2019-11-06 DIAGNOSIS — M25511 Pain in right shoulder: Secondary | ICD-10-CM | POA: Diagnosis present

## 2019-11-06 DIAGNOSIS — G8929 Other chronic pain: Secondary | ICD-10-CM | POA: Insufficient documentation

## 2019-11-06 DIAGNOSIS — M6281 Muscle weakness (generalized): Secondary | ICD-10-CM | POA: Diagnosis present

## 2019-11-06 NOTE — Therapy (Signed)
[facility] [address] [place], [place], [address] Phone: [phone]   Fax:  [phone]  Physical Therapy Treatment  Patient Details  Name: Brooke Cruz MRN: [id number] Date of Birth: [birth year] Referring Provider (PT): Foster Simpson, DO   Encounter Date: 11/06/2019   PT End of Session - 11/06/19 1150    Visit Number 3    Number of Visits 8    Date for PT Re-Evaluation 11/19/19    Authorization Type Medicare Part A and B    PT Start Time 1145    PT Stop Time 1223    PT Time Calculation (min) 38 min    Activity Tolerance Patient tolerated treatment well    Behavior During Therapy [facility] for tasks assessed/performed           Past Medical History:  Diagnosis Date  . Anxiety and depression   . Arthritis   . Asthma    exercise induced  . Chronic back pain   . Depression   . Diverticulitis   . GERD (gastroesophageal reflux disease)   . Hyperlipemia   . Hypertension   . Peripheral neuropathy   . PTSD (post-traumatic stress disorder)     Past Surgical History:  Procedure Laterality Date  . BREAST REDUCTION SURGERY Bilateral 05/24/2019   Procedure: mammary reduction;  Surgeon: Peggye Form, DO;  Location: [facility];  Service: Plastics;  Laterality: Bilateral;  3.5 hours  . CESAREAN SECTION     x 2  . NASAL SINUS SURGERY      There were no vitals filed for this visit.   Subjective Assessment - 11/06/19 1149    Subjective Felt "wrecked" after last appointment. The soreness did wear off though. today my back and hips are hurting.    Patient Stated Goals To work through scar tissue                             [facility] Adult PT Treatment/Exercise - 11/06/19 0001      Manual Therapy   Soft tissue mobilization bil breast tissue: IASTM, rolling, gliding, stretching, isolated scar tissue mobs.                   PT Education - 11/06/19 1239    Education  Details self mobilization, postural alignment. fascia mobilization vs deep tissue, discussed options and outcomes with another surgery, resting postural alignment in seated    Person(s) Educated Patient    Methods Explanation    Comprehension Verbalized understanding;Need further instruction            PT Short Term Goals - 10/22/19 1209      PT SHORT TERM GOAL #1   Title Pt will be I and compliant with initial HEP.    Time 1    Period Weeks    Status New    Target Date 10/29/19      PT SHORT TERM GOAL #2   Title Pt will be able to walk for 5-10 minute at a time, to initiate an exercise routine.    Time 2    Period Weeks    Status New    Target Date 11/19/19      PT SHORT TERM GOAL #3   Title Pt will incr B cervical rotation to >/= 60 degees, in order to check blindspots when driving.    Baseline 45    Time 3    Period  Weeks    Status New    Target Date 11/12/19             PT Long Term Goals - 10/22/19 1210      PT LONG TERM GOAL #1   Title Pt will increase B shoulder MMT to 4/5, in order to improve participation in IADLs.    Time 4    Period Weeks    Status New    Target Date 11/19/19      PT LONG TERM GOAL #2   Title Pt will reduce FOTO limitation to 52% diability or less to demonstrate a significant improvement in function.    Time 4    Period Weeks    Status New    Target Date 11/19/19      PT LONG TERM GOAL #3   Title Pt will report ability to go to grocery store and navigate at least half the store for increased participation in community activity.    Time 4    Period Weeks    Status New    Target Date 11/19/19                 Plan - 11/06/19 1228    Clinical Impression Statement extensive manual therapy again today with focus on mobilizing superficial layers of fascia to reduce restriction during movement. Was sore after but felt okay.    PT Treatment/Interventions ADLs/Self Care Home Management;Cryotherapy;Spinal Manipulations;Joint  Manipulations;Vasopneumatic Device;Passive range of motion;Dry needling;Taping;Scar mobilization;Manual techniques;Patient/family education;Therapeutic exercise;Ultrasound;Traction;Electrical Stimulation;Moist Heat;Neuromuscular re-education;Iontophoresis 4mg /ml Dexamethasone;Therapeutic activities;Functional mobility training    PT Next Visit Plan continue breast tissue mobility, postural alignment    PT Home Exercise Plan RWRXGEHE - Medbridge    Consulted and Agree with Plan of Care Patient           Patient will benefit from skilled therapeutic intervention in order to improve the following deficits and impairments:  Hypomobility, Decreased mobility, Decreased activity tolerance, Decreased strength, Increased fascial restricitons, Impaired flexibility, Postural dysfunction, Pain, Improper body mechanics, Impaired perceived functional ability, Decreased scar mobility, Decreased range of motion  Visit Diagnosis: Status post bilateral breast reduction  Pain in thoracic spine  Cervical pain (neck)     Problem List Patient Active Problem List   Diagnosis Date Noted  . S/P bilateral breast reduction 06/19/2019  . Breast pain 04/24/2019  . Migraine 04/04/2018  . Back pain 04/04/2018  . Neck pain 04/04/2018  . Symptomatic mammary hypertrophy 04/04/2018  . Small fiber neuropathy 01/10/2017  . Neuropathy 10/20/2016  . Anxiety 10/12/2016  . Chronic left-sided low back pain 04/14/2016  . Paresthesia 02/09/2016  . Blurred vision 02/09/2016  . Neck pain on right side 02/09/2016  . Major depressive disorder, recurrent episode, moderate (HCC) 05/27/2015  . Diverticulitis 12/27/2014  . Myalgia 11/25/2014  . Hyperlipidemia 07/26/2014  . Hypertension 05/31/2014  . Exercise-induced asthma 02/21/2014  . GERD (gastroesophageal reflux disease) 02/21/2014  . Depression 02/20/2014    Jameson Tormey C. Doreatha Offer PT, DPT 11/06/19 12:41 PM   [facility] [address] [place], [place], [place] Phone: [phone]   Fax:  [phone]  Name: Brooke Cruz MRN: [name] Date of Birth: [birth year]

## 2019-11-09 ENCOUNTER — Encounter: Payer: Self-pay | Admitting: Physical Therapy

## 2019-11-09 ENCOUNTER — Other Ambulatory Visit: Payer: Self-pay

## 2019-11-09 ENCOUNTER — Ambulatory Visit: Payer: Medicare Other | Admitting: Physical Therapy

## 2019-11-09 DIAGNOSIS — Z9889 Other specified postprocedural states: Secondary | ICD-10-CM

## 2019-11-09 DIAGNOSIS — M542 Cervicalgia: Secondary | ICD-10-CM

## 2019-11-09 DIAGNOSIS — M546 Pain in thoracic spine: Secondary | ICD-10-CM

## 2019-11-09 NOTE — Therapy (Signed)
[place], [place], [address] Phone: [phone]   Fax:  [phone]  Physical Therapy Treatment  Patient Details  Name: Brooke Cruz MRN: [id number] Date of Birth: Aug 15, 1970 Referring Provider (PT): Audelia Hives, DO   Encounter Date: 11/09/2019   PT End of Session - 11/09/19 0849    Visit Number 4    Number of Visits 8    Date for PT Re-Evaluation 11/19/19    Authorization Type Medicare Part A and B    PT Start Time 0846    PT Stop Time 0925    PT Time Calculation (min) 39 min    Activity Tolerance Patient tolerated treatment well    Behavior During Therapy [facility] for tasks assessed/performed           Past Medical History:  Diagnosis Date  . Anxiety and depression   . Arthritis   . Asthma    exercise induced  . Chronic back pain   . Depression   . Diverticulitis   . GERD (gastroesophageal reflux disease)   . Hyperlipemia   . Hypertension   . Peripheral neuropathy   . PTSD (post-traumatic stress disorder)     Past Surgical History:  Procedure Laterality Date  . BREAST REDUCTION SURGERY Bilateral 05/24/2019   Procedure: mammary reduction;  Surgeon: Wallace Going, DO;  Location: [place];  Service: Plastics;  Laterality: Bilateral;  3.5 hours  . CESAREAN SECTION     x 2  . NASAL SINUS SURGERY      There were no vitals filed for this visit.   Subjective Assessment - 11/09/19 0848    Subjective Was very sore after last visit and is sore today. Has a lot of pain when she is more active and has been taking walks with her daughter to get her outside and babysat yesterday. Had to take a pain pill this AM.    Patient Stated Goals To work through scar tissue    Currently in Pain? Yes    Pain Score 6     Pain Location Breast    Pain Orientation Left;Right   L>R   Pain Descriptors / Indicators Sore                             [facility] Adult  PT Treatment/Exercise - 11/09/19 0001      Exercises   Exercises Shoulder      Shoulder Exercises: Seated   Horizontal ABduction Limitations reaching while sitting on physioball    Other Seated Exercises physioball press into thighs; bouncing & pelvic clocks on physioball    Other Seated Exercises shoulder rolls 10 fwd, 10 back      Shoulder Exercises: Prone   Retraction AROM;20 reps      Shoulder Exercises: ROM/Strengthening   Nustep 5 min L3 UE & LE      Shoulder Exercises: Stretch   Other Shoulder Stretches flexion stretch- physioball rollout, midline & diagonal      Manual Therapy   Joint Mobilization prone thoracic & rib mobility                    PT Short Term Goals - 11/09/19 0857      PT SHORT TERM GOAL #1   Title Pt will be I and compliant with initial HEP.    Status Achieved      PT SHORT TERM GOAL #2  Title Pt will be able to walk for 5-10 minute at a time, to initiate an exercise routine.    Baseline able to walk 20 min    Status Achieved      PT SHORT TERM GOAL #3   Title Pt will incr B cervical rotation to >/= 60 degees, in order to check blindspots when driving.    Baseline Rt 6-, Lt 45    Status Partially Met             PT Long Term Goals - 10/22/19 1210      PT LONG TERM GOAL #1   Title Pt will increase B shoulder MMT to 4/5, in order to improve participation in IADLs.    Time 4    Period Weeks    Status New    Target Date 11/19/19      PT LONG TERM GOAL #2   Title Pt will reduce FOTO limitation to 52% diability or less to demonstrate a significant improvement in function.    Time 4    Period Weeks    Status New    Target Date 11/19/19      PT LONG TERM GOAL #3   Title Pt will report ability to go to grocery store and navigate at least half the store for increased participation in community activity.    Time 4    Period Weeks    Status New    Target Date 11/19/19                 Plan - 11/09/19 0925     Clinical Impression Statement decreased manual therapy due to reported soreness and pt reported general body pain at 6/10 today. Exercises to focus on low-impact movement with activaiton and stretching of postural musculature to stress scar tissue. Asked that she find a low-impact activity she can do at home as well when she is not feeling well.    PT Treatment/Interventions ADLs/Self Care Home Management;Cryotherapy;Spinal Manipulations;Joint Manipulations;Vasopneumatic Device;Passive range of motion;Dry needling;Taping;Scar mobilization;Manual techniques;Patient/family education;Therapeutic exercise;Ultrasound;Traction;Electrical Stimulation;Moist Heat;Neuromuscular re-education;Iontophoresis 6m/ml Dexamethasone;Therapeutic activities;Functional mobility training    PT Next Visit Plan continue stretching/activation as tolerated    PT Home Exercise Plan [place] Agree with Plan of Care Patient           Patient will benefit from skilled therapeutic intervention in order to improve the following deficits and impairments:  Hypomobility, Decreased mobility, Decreased activity tolerance, Decreased strength, Increased fascial restricitons, Impaired flexibility, Postural dysfunction, Pain, Improper body mechanics, Impaired perceived functional ability, Decreased scar mobility, Decreased range of motion  Visit Diagnosis: Status post bilateral breast reduction  Pain in thoracic spine  Cervical pain (neck)     Problem List Patient Active Problem List   Diagnosis Date Noted  . S/P bilateral breast reduction 06/19/2019  . Breast pain 04/24/2019  . Migraine 04/04/2018  . Back pain 04/04/2018  . Neck pain 04/04/2018  . Symptomatic mammary hypertrophy 04/04/2018  . Small fiber neuropathy 01/10/2017  . Neuropathy 10/20/2016  . Anxiety 10/12/2016  . Chronic left-sided low back pain 04/14/2016  . Paresthesia 02/09/2016  . Blurred vision 02/09/2016  . Neck pain on  right side 02/09/2016  . Major [place] disorder, recurrent episode, moderate [place] 05/27/2015  . Diverticulitis 12/27/2014  . Myalgia 11/25/2014  . Hyperlipidemia 07/26/2014  . Hypertension 05/31/2014  . Exercise-induced asthma 02/21/2014  . GERD (gastroesophageal reflux disease) 02/21/2014  . Depression 02/20/2014   Mahogony Gilchrest C. Bisma Klett PT,  DPT 11/09/19 9:27 AM   [facility] [address] [place], [place], [address] Phone: [phone]   Fax:  [phone]  Name: Brooke Cruz MRN: [id number] Date of Birth: 1971/02/25

## 2019-11-20 ENCOUNTER — Ambulatory Visit: Payer: Medicare Other | Admitting: Physical Therapy

## 2019-11-21 ENCOUNTER — Telehealth: Payer: Self-pay | Admitting: Physical Therapy

## 2019-11-21 NOTE — Telephone Encounter (Signed)
LVM- I was advised that pt arrived for an appointment and there was a mix up in cancelling so I called to see if she needed anything from me. Requested call back with any questions or concerns.  Sundiata Ferrick C. Danyele Smejkal PT, DPT 11/21/19 4:49 PM

## 2019-11-22 ENCOUNTER — Other Ambulatory Visit: Payer: Self-pay

## 2019-11-22 ENCOUNTER — Encounter: Payer: Self-pay | Admitting: Physical Therapy

## 2019-11-22 ENCOUNTER — Ambulatory Visit: Payer: Medicare Other | Admitting: Physical Therapy

## 2019-11-22 DIAGNOSIS — Z9889 Other specified postprocedural states: Secondary | ICD-10-CM | POA: Diagnosis not present

## 2019-11-22 DIAGNOSIS — M546 Pain in thoracic spine: Secondary | ICD-10-CM

## 2019-11-22 DIAGNOSIS — G8929 Other chronic pain: Secondary | ICD-10-CM

## 2019-11-22 DIAGNOSIS — M542 Cervicalgia: Secondary | ICD-10-CM

## 2019-11-22 NOTE — Therapy (Signed)
[facility] [address] [place], [place], [address] Phone: [phone]   Fax:  [phone]  Physical Therapy Treatment  Patient Details  Name: Brooke Cruz MRN: [id number] Date of Birth: 1970-05-16 Referring Provider (PT): Foster Simpson, DO   Encounter Date: 11/22/2019   PT End of Session - 11/22/19 1453    Visit Number 5    Number of Visits 9    Date for PT Re-Evaluation 12/21/19    Authorization Type Medicare Part A and B    PT Start Time 1445    PT Stop Time 1532    PT Time Calculation (min) 47 min    Activity Tolerance Patient tolerated treatment well           Past Medical History:  Diagnosis Date  . Anxiety and depression   . Arthritis   . Asthma    exercise induced  . Chronic back pain   . Depression   . Diverticulitis   . GERD (gastroesophageal reflux disease)   . Hyperlipemia   . Hypertension   . Peripheral neuropathy   . PTSD (post-traumatic stress disorder)     Past Surgical History:  Procedure Laterality Date  . BREAST REDUCTION SURGERY Bilateral 05/24/2019   Procedure: mammary reduction;  Surgeon: Peggye Form, DO;  Location: [facility];  Service: Plastics;  Laterality: Bilateral;  3.5 hours  . CESAREAN SECTION     x 2  . NASAL SINUS SURGERY      There were no vitals filed for this visit.   Subjective Assessment - 11/22/19 1738    Subjective Pain is 5/10 in back.  Min soreness in breast tissue today.  Walks with apparent pain in legs, back, guarded.              [facility] PT Assessment - 11/22/19 0001      Assessment   Medical Diagnosis S/P B Breast Reduction    Referring Provider (PT) Foster Simpson, DO    Onset Date/Surgical Date 05/24/19    Hand Dominance Right    Next MD Visit 6 weeks out    Prior Therapy Yes   Feb 2020 for low back     Precautions   Precautions None      Home Environment   Additional Comments 1st floor apartment       Prior Function   Vocation On disability    Leisure shopping      Cognition   Overall Cognitive Status Within Functional Limits for tasks assessed      Observation/Other Assessments   Focus on Therapeutic Outcomes (FOTO)  NT       AROM   Cervical Flexion 45    Cervical Extension 20     Cervical - Right Side Bend 25    Cervical - Left Side Bend 115    Cervical - Right Rotation 50    Cervical - Left Rotation 50       Strength   Right Shoulder Flexion 4/5    Right Shoulder ABduction 4/5    Left Shoulder Flexion 4-/5    Left Shoulder ABduction 3+/5            [facility] Adult PT Treatment/Exercise - 11/22/19 0001      Self-Care   Self-Care Posture;Other Self-Care Comments    Posture posture seated, head positon    Other Self-Care Comments  stretching, HEP, POC , options for continuing care       Shoulder Exercises: Stretch  Other Shoulder Stretches thoracic spine extension seated over 1/2 roller x 10 arms behind head     Other Shoulder Stretches supine T arms with LTR x 10       Manual Therapy   Soft tissue mobilization Rt lower quadrant breast tissue and along incision, Rt pec with arm overhead: rolling , gliding , breif work to L breast outer knots , scar tissue massage       Neck Exercises: Stretches   Upper Trapezius Stretch 3 reps;30 seconds    Corner Stretch Limitations verbal explanation     Other Neck Stretches rotation added HEP                   PT Education - 11/22/19 1744    Education Details HEP, POC, other options for cont PT    Person(s) Educated Patient    Methods Explanation;Demonstration;Verbal cues    Comprehension Verbalized understanding;Returned demonstration;Need further instruction            PT Short Term Goals - 11/22/19 1457      PT SHORT TERM GOAL #1   Title Pt will be I and compliant with initial HEP.    Status Achieved      PT SHORT TERM GOAL #2   Title Pt will be able to walk for 5-10 minute at a time, to initiate an  exercise routine.    Status Achieved      PT SHORT TERM GOAL #3   Title Pt will incr B cervical rotation to >/= 60 degees, in order to check blindspots when driving.    Baseline about 50 deg bilateral    Status On-going             PT Long Term Goals - 11/22/19 1501      PT LONG TERM GOAL #1   Title Pt will increase B shoulder MMT to 4/5, in order to improve participation in IADLs.    Status On-going      PT LONG TERM GOAL #2   Title Pt will reduce FOTO limitation to 52% disability or less to demonstrate a significant improvement in function.    Status On-going      PT LONG TERM GOAL #3   Title Pt will report ability to go to grocery store and navigate at least half the store for increased participation in community activity.    Status On-going                 Plan - 11/22/19 1505    Clinical Impression Statement Patient see today , unable to tell if there has been a lasting effect of PT on scar tissue in her breasts. She has not done much UE strengthening during PT. She will see her MD next week and make a decision about surgey to remove scar tissue.  I also recommend she work with PT if she does return on neck and shoulder pain as this is also a limiting factor.  She was given info on [facility] as they are more amiliar with this specific technique.    PT Treatment/Interventions ADLs/Self Care Home Management;Cryotherapy;Spinal Manipulations;Joint Manipulations;Vasopneumatic Device;Passive range of motion;Dry needling;Taping;Scar mobilization;Manual techniques;Patient/family education;Therapeutic exercise;Ultrasound;Traction;Electrical Stimulation;Moist Heat;Neuromuscular re-education;Iontophoresis 4mg /ml Dexamethasone;Therapeutic activities;Functional mobility training    PT Next Visit Plan continue stretching/activation as tolerated    PT Home Exercise Plan RWRXGEHE - Medbridge, added neck stretches    Consulted and Agree with Plan of Care Patient  Patient will benefit from skilled therapeutic intervention in order to improve the following deficits and impairments:  Hypomobility, Decreased mobility, Decreased activity tolerance, Decreased strength, Increased fascial restricitons, Impaired flexibility, Postural dysfunction, Pain, Improper body mechanics, Impaired perceived functional ability, Decreased scar mobility, Decreased range of motion  Visit Diagnosis: Status post bilateral breast reduction  Pain in thoracic spine  Cervical pain (neck)  Chronic left shoulder pain     Problem List Patient Active Problem List   Diagnosis Date Noted  . S/P bilateral breast reduction 06/19/2019  . Breast pain 04/24/2019  . Migraine 04/04/2018  . Back pain 04/04/2018  . Neck pain 04/04/2018  . Symptomatic mammary hypertrophy 04/04/2018  . Small fiber neuropathy 01/10/2017  . Neuropathy 10/20/2016  . Anxiety 10/12/2016  . Chronic left-sided low back pain 04/14/2016  . Paresthesia 02/09/2016  . Blurred vision 02/09/2016  . Neck pain on right side 02/09/2016  . Major depressive disorder, recurrent episode, moderate (HCC) 05/27/2015  . Diverticulitis 12/27/2014  . Myalgia 11/25/2014  . Hyperlipidemia 07/26/2014  . Hypertension 05/31/2014  . Exercise-induced asthma 02/21/2014  . GERD (gastroesophageal reflux disease) 02/21/2014  . Depression 02/20/2014    Darald Uzzle 11/22/2019, 5:55 PM  [facility] [address] [place], [place], [address] Phone: [phone]   Fax:  [phone]  Name: QUENNA DOEPKE MRN: [id number] Date of Birth: [birth year]  Karie Mainland, PT 11/22/19 5:55 PM Phone: [phone] Fax: [phone]

## 2019-11-23 ENCOUNTER — Ambulatory Visit: Payer: Medicare Other | Admitting: Physical Therapy

## 2019-11-27 ENCOUNTER — Other Ambulatory Visit: Payer: Self-pay

## 2019-11-27 ENCOUNTER — Encounter: Payer: Self-pay | Admitting: Plastic Surgery

## 2019-11-27 ENCOUNTER — Ambulatory Visit (INDEPENDENT_AMBULATORY_CARE_PROVIDER_SITE_OTHER): Payer: Medicare Other | Admitting: Plastic Surgery

## 2019-11-27 VITALS — BP 124/82 | HR 94 | Temp 99.2°F

## 2019-11-27 DIAGNOSIS — M542 Cervicalgia: Secondary | ICD-10-CM

## 2019-11-27 DIAGNOSIS — M791 Myalgia, unspecified site: Secondary | ICD-10-CM

## 2019-11-27 DIAGNOSIS — N62 Hypertrophy of breast: Secondary | ICD-10-CM

## 2019-11-27 DIAGNOSIS — N644 Mastodynia: Secondary | ICD-10-CM

## 2019-11-27 DIAGNOSIS — Z9889 Other specified postprocedural states: Secondary | ICD-10-CM

## 2019-11-27 DIAGNOSIS — G8929 Other chronic pain: Secondary | ICD-10-CM

## 2019-11-27 DIAGNOSIS — Z719 Counseling, unspecified: Secondary | ICD-10-CM

## 2019-11-27 DIAGNOSIS — M546 Pain in thoracic spine: Secondary | ICD-10-CM

## 2019-11-27 NOTE — Addendum Note (Signed)
Addended by: Peggye Form on: 11/27/2019 11:34 AM   Modules accepted: Orders

## 2019-11-27 NOTE — Progress Notes (Addendum)
The patient is a 49 year old female here for follow-up after undergoing bilateral breast reduction February 2021.  The patient is very pleased with her overall appearance and size.  She has some firm areas on the left breast laterally.  These appear to be fat necrosis.  We tried to do some physical therapy.  Her range of motion was slightly improved and the area was massaged but she was left with pain for about a week.  She has a little bit of tightness on the right side but I do not feel the the same fat necrosis this seems to be more myofascial in nature.  She does not really want to have surgery.  She is open to the idea of a Kenalog injection to see if that will break up some of the fat.  This is reasonable and she would like to try.  We will get this set up for her.  She also complains of continued neck and back pain.  We will put in a referral for physical therapy for the neck and back pain.  In the meantime I would continue with the physical therapy for the breast and it does seem to be helping on the right side.  Patient is in agreement.

## 2019-11-28 ENCOUNTER — Ambulatory Visit: Payer: Medicare Other | Admitting: Physical Therapy

## 2019-11-30 ENCOUNTER — Ambulatory Visit: Payer: Medicare Other | Admitting: Physical Therapy

## 2019-12-04 ENCOUNTER — Ambulatory Visit: Payer: Medicare Other | Admitting: Physical Therapy

## 2019-12-04 ENCOUNTER — Other Ambulatory Visit: Payer: Self-pay

## 2019-12-04 DIAGNOSIS — M6281 Muscle weakness (generalized): Secondary | ICD-10-CM

## 2019-12-04 DIAGNOSIS — M25512 Pain in left shoulder: Secondary | ICD-10-CM

## 2019-12-04 DIAGNOSIS — M25511 Pain in right shoulder: Secondary | ICD-10-CM

## 2019-12-04 DIAGNOSIS — M546 Pain in thoracic spine: Secondary | ICD-10-CM

## 2019-12-04 DIAGNOSIS — M545 Low back pain, unspecified: Secondary | ICD-10-CM

## 2019-12-04 DIAGNOSIS — Z9889 Other specified postprocedural states: Secondary | ICD-10-CM

## 2019-12-04 DIAGNOSIS — M542 Cervicalgia: Secondary | ICD-10-CM

## 2019-12-04 NOTE — Therapy (Signed)
[facility] [address] [place], [place], [address] Phone: [phone]   Fax:  [phone]  Physical Therapy Evaluation  Patient Details  Name: Brooke Cruz MRN: [id number] Date of Birth: 01-Jul-1970 Referring Provider (PT): Foster Simpson, DO   Encounter Date: 12/04/2019   PT End of Session - 12/04/19 1306    Visit Number 6    Number of Visits 14    Date for PT Re-Evaluation 01/18/20    Authorization Type Medicare Part A and B    PT Start Time 1130    PT Stop Time 1210    PT Time Calculation (min) 40 min    Activity Tolerance Patient tolerated treatment well    Behavior During Therapy [facility] for tasks assessed/performed           Past Medical History:  Diagnosis Date  . Anxiety and depression   . Arthritis   . Asthma    exercise induced  . Chronic back pain   . Depression   . Diverticulitis   . GERD (gastroesophageal reflux disease)   . Hyperlipemia   . Hypertension   . Peripheral neuropathy   . PTSD (post-traumatic stress disorder)     Past Surgical History:  Procedure Laterality Date  . BREAST REDUCTION SURGERY Bilateral 05/24/2019   Procedure: mammary reduction;  Surgeon: Peggye Form, DO;  Location: [facility];  Service: Plastics;  Laterality: Bilateral;  3.5 hours  . CESAREAN SECTION     x 2  . NASAL SINUS SURGERY      There were no vitals filed for this visit.    Subjective Assessment - 12/04/19 1138    Subjective Pt saw MD.  She ordered PT for neck and low back.  Her low back pain is most debilitating issue.  The neck just just flares up.  I think the back is soft tissue.    Pertinent History small fiber neuropathy, back pain , neck pain , bilateral breast reduction    Limitations Sitting;Lifting;House hold activities;Reading;Standing;Walking    How long can you sit comfortably? can cause spasm if not supported    How long can you stand comfortably? 20 min    How  long can you walk comfortably? walking difficult because of legs    Diagnostic tests none recent    Patient Stated Goals Pt will be able feel like I can move more    Currently in Pain? Yes    Pain Score 5     Pain Location Back    Pain Orientation Right;Left;Lower    Pain Descriptors / Indicators Sore;Tightness;Aching    Pain Type Chronic pain    Pain Onset More than a month ago    Aggravating Factors  walking and standing    Pain Relieving Factors meds, stretch, activity modification    Multiple Pain Sites Yes    Pain Score 0              [facility] PT Assessment - 12/04/19 0001      Assessment   Medical Diagnosis S/P B Breast Reduction, neck pain and back pain     Referring Provider (PT) Foster Simpson, DO    Onset Date/Surgical Date 05/24/19    Hand Dominance Right    Next MD Visit 6 weeks out    Prior Therapy Yes   Feb 2020 for low back     Precautions   Precautions None      Prior Function   Vocation On disability  Leisure shopping      Cognition   Overall Cognitive Status Within Functional Limits for tasks assessed      Observation/Other Assessments   Focus on Therapeutic Outcomes (FOTO)  NT       Sensation   Light Touch Impaired by gross assessment    Additional Comments pain and burning in bilateral lower LEs to knee on L side       Coordination   Gross Motor Movements are Fluid and Coordinated Not tested      Posture/Postural Control   Posture/Postural Control Postural limitations    Postural Limitations Rounded Shoulders;Forward head;Increased thoracic kyphosis      AROM   Lumbar Flexion hands to distal thigh (50% ore more limited) pain     Lumbar Extension pain, 90% limted     Lumbar - Right Side Bend 25%     Lumbar - Left Side Bend 50% pain     Lumbar - Right Rotation pain 50%     Lumbar - Left Rotation pain 50%       Strength   Right Hip Flexion 3+/5    Right Hip ABduction 3+/5    Left Hip Flexion 4-/5    Left Hip ABduction 3+/5    Right  Knee Flexion 5/5    Right Knee Extension 4+/5    Left Knee Flexion 4+/5    Left Knee Extension 4+/5      Palpation   Palpation comment pain lateral to L3-L4-L5 on L side and across low lumbar, glutes       Transfers   Comments sit to stand with increased effort            Objective measurements completed on examination: See above findings.       [facility] Adult PT Treatment/Exercise - 12/04/19 0001      Shoulder Exercises: ROM/Strengthening   Nustep 8 min UE and LE L5              PT Education - 12/04/19 1303    Education Details POC, PNE regarding movement as medicine    Person(s) Educated Patient    Methods Explanation    Comprehension Verbalized understanding            PT Short Term Goals - 11/22/19 1457      PT SHORT TERM GOAL #1   Title Pt will be I and compliant with initial HEP.    Status Achieved      PT SHORT TERM GOAL #2   Title Pt will be able to walk for 5-10 minute at a time, to initiate an exercise routine.    Status Achieved      PT SHORT TERM GOAL #3   Title Pt will incr B cervical rotation to >/= 60 degees, in order to check blindspots when driving.    Baseline about 50 deg bilateral    Status On-going             PT Long Term Goals - 12/04/19 1331      PT LONG TERM GOAL #1   Title Pt will increase B shoulder MMT to 4/5, in order to improve participation in IADLs.    Status On-going    Target Date 01/18/20      PT LONG TERM GOAL #2   Title Pt will reduce FOTO limitation to 52% disability or less to demonstrate a significant improvement in function.    Baseline 58%    Status On-going    Target Date 01/18/20  PT LONG TERM GOAL #3   Title Pt will report ability to go to grocery store and navigate at least half the store for increased participation in community activity.    Status On-going    Target Date 01/18/20      PT LONG TERM GOAL #4   Title Pt will be able to perform transfers with greater ease and less pain (sit to  stand, car, supine to sit)    Time 6    Period Weeks    Status New    Target Date 01/18/20      PT LONG TERM GOAL #5   Title Pt will be able to lift 25 lbs from the floor with no increase in pain in back, neck    Time 6    Period Weeks    Status New    Target Date 01/18/20                  Plan - 12/04/19 1320    Clinical Impression Statement Patient was re-evaluated to include back.  She is quite stiff and weak in hips and core.  She will benefit from PT to improve general mobility in addition to offering manual to upper quadrant for post surgical cording.  She will be seen 1 x per week with Cancer rehab and 1 x with orthopedics for back, LE strength.  Her pain is ongoing and chronic, will need to offer PNE to shift her mindset and improve function despite her pain.    Personal Factors and Comorbidities Behavior Pattern;Comorbidity 1;Comorbidity 3+;Comorbidity 2;Past/Current Experience;Time since onset of injury/illness/exacerbation    Comorbidities Fibromyalgia, HLD, HTN    Examination-Activity Limitations Carry;Locomotion Level;Reach Overhead;Stand;Sit;Bend;Lift    Examination-Participation Restrictions Cleaning;Laundry;Community Activity;Shop;Interpersonal Relationship    Stability/Clinical Decision Making Evolving/Moderate complexity    Clinical Decision Making Moderate    Rehab Potential Fair    PT Frequency 2x / week    PT Duration 6 weeks    PT Treatment/Interventions ADLs/Self Care Home Management;Cryotherapy;Spinal Manipulations;Joint Manipulations;Vasopneumatic Device;Passive range of motion;Dry needling;Taping;Scar mobilization;Manual techniques;Patient/family education;Therapeutic exercise;Ultrasound;Traction;Electrical Stimulation;Moist Heat;Neuromuscular re-education;Iontophoresis 4mg /ml Dexamethasone;Therapeutic activities;Functional mobility training    PT Next Visit Plan continue stretching/activation as tolerated. PNE. Spine mobility and functional training. Get  more specific with her limitations in her home environment    PT Home Exercise Plan RWRXGEHE - Medbridge, added neck stretches and basic lumbar    Consulted and Agree with Plan of Care Patient           Patient will benefit from skilled therapeutic intervention in order to improve the following deficits and impairments:  Hypomobility, Decreased mobility, Decreased activity tolerance, Decreased strength, Increased fascial restricitons, Impaired flexibility, Postural dysfunction, Pain, Improper body mechanics, Impaired perceived functional ability, Decreased scar mobility, Decreased range of motion  Visit Diagnosis: Status post bilateral breast reduction  Pain in thoracic spine  Cervical pain (neck)  Chronic left shoulder pain  Chronic right shoulder pain  Muscle weakness (generalized)  Bilateral low back pain, unspecified chronicity, unspecified whether sciatica present     Problem List Patient Active Problem List   Diagnosis Date Noted  . Encounter for counseling 11/27/2019  . S/P bilateral breast reduction 06/19/2019  . Breast pain 04/24/2019  . Migraine 04/04/2018  . Back pain 04/04/2018  . Neck pain 04/04/2018  . Symptomatic mammary hypertrophy 04/04/2018  . Small fiber neuropathy 01/10/2017  . Neuropathy 10/20/2016  . Anxiety 10/12/2016  . Chronic left-sided low back pain 04/14/2016  . Paresthesia 02/09/2016  . Blurred vision 02/09/2016  .  Neck pain on right side 02/09/2016  . Major depressive disorder, recurrent episode, moderate (HCC) 05/27/2015  . Diverticulitis 12/27/2014  . Myalgia 11/25/2014  . Hyperlipidemia 07/26/2014  . Hypertension 05/31/2014  . Exercise-induced asthma 02/21/2014  . GERD (gastroesophageal reflux disease) 02/21/2014  . Depression 02/20/2014    Celisse Ciulla 12/04/2019, 1:35 PM  [facility] [address] [place], [place], [address] Phone: [phone]   Fax:  [phone]  Name:  Tana ConchVictoria L Knoblock MRN: [id number] Date of Birth: 10-26-1970   Karie MainlandJennifer Inaya Gillham, PT 12/04/19 1:35 PM Phone: [phone] Fax: [phone]

## 2019-12-04 NOTE — Addendum Note (Signed)
Addended by: Karie Mainland L on: 12/04/2019 01:36 PM   Modules accepted: Orders

## 2019-12-15 ENCOUNTER — Ambulatory Visit: Payer: Medicare Other | Attending: Plastic Surgery | Admitting: Physical Therapy

## 2019-12-18 ENCOUNTER — Telehealth: Payer: Self-pay | Admitting: Physical Therapy

## 2019-12-18 NOTE — Telephone Encounter (Signed)
Spoke with patient regarding NS on 9/11. Reports she was not sure how to cancel with it being such an early appointment.  Brooke Cruz C. Daven Montz PT, DPT 12/18/19 11:54 AM

## 2019-12-20 ENCOUNTER — Ambulatory Visit: Payer: Medicare Other | Admitting: Physical Therapy

## 2020-01-01 ENCOUNTER — Ambulatory Visit: Payer: Medicare Other | Admitting: Physical Therapy

## 2020-01-02 ENCOUNTER — Other Ambulatory Visit: Payer: Medicare Other

## 2020-01-12 ENCOUNTER — Encounter: Payer: Self-pay | Admitting: Physical Therapy

## 2020-01-12 ENCOUNTER — Ambulatory Visit: Payer: Medicare Other | Attending: Plastic Surgery | Admitting: Physical Therapy

## 2020-01-12 ENCOUNTER — Other Ambulatory Visit: Payer: Self-pay

## 2020-01-12 DIAGNOSIS — M546 Pain in thoracic spine: Secondary | ICD-10-CM | POA: Insufficient documentation

## 2020-01-12 DIAGNOSIS — Z9889 Other specified postprocedural states: Secondary | ICD-10-CM

## 2020-01-12 DIAGNOSIS — M542 Cervicalgia: Secondary | ICD-10-CM | POA: Insufficient documentation

## 2020-01-12 NOTE — Therapy (Addendum)
[facility] [place], [place], [address] Phone: [phone]   Fax:  [phone]  Physical Therapy Treatment/Discharge  Patient Details  Name: Brooke Cruz MRN: [id number] Date of Birth: 1970-12-06 Referring Provider (PT): Audelia Hives, DO   Encounter Date: 01/12/2020   PT End of Session - 01/12/20 1117    Visit Number 7    Number of Visits 14    Date for PT Re-Evaluation 01/18/20    Authorization Type Medicare Part A and B    Progress Note Due on Visit 10    PT Start Time 1115    PT Stop Time 1210    PT Time Calculation (min) 55 min    Activity Tolerance Patient tolerated treatment well    Behavior During Therapy [facility] for tasks assessed/performed           Past Medical History:  Diagnosis Date  . Anxiety and depression   . Arthritis   . Asthma    exercise induced  . Chronic back pain   . Depression   . Diverticulitis   . GERD (gastroesophageal reflux disease)   . Hyperlipemia   . Hypertension   . Peripheral neuropathy   . PTSD (post-traumatic stress disorder)     Past Surgical History:  Procedure Laterality Date  . BREAST REDUCTION SURGERY Bilateral 05/24/2019   Procedure: mammary reduction;  Surgeon: Wallace Going, DO;  Location: [place];  Service: Plastics;  Laterality: Bilateral;  3.5 hours  . CESAREAN SECTION     x 2  . NASAL SINUS SURGERY      There were no vitals filed for this visit.   Subjective Assessment - 01/12/20 1117    Subjective not bad today. Exercises were going well but then I got too busy. Last time I did the exercises was last week. Typical day: Drop child off at school at 830, go home and rest, do a little housework, rest and go get her at 3.    Currently in Pain? Yes    Pain Score 4     Pain Location Back    Pain Orientation Lower    Pain Descriptors / Indicators Aching    Pain Radiating Towards bil hips    Aggravating Factors   walking and standing, bending    Pain Relieving Factors meds, rest, activity modification              [facility] PT Assessment - 01/12/20 0001      Posture/Postural Control   Posture Comments notable Lt illiac elevation in standing; Rt post innominate rotation in supine with LLD                         [facility] Adult PT Treatment/Exercise - 01/12/20 0001      Exercises   Exercises Knee/Hip      Knee/Hip Exercises: Stretches   Other Knee/Hip Stretches Hesh- self correction for Rt post illium 2x2 min      Knee/Hip Exercises: Standing   Other Standing Knee Exercises at counter: L stretch, mini squats, hip abd      Knee/Hip Exercises: Supine   Other Supine Knee/Hip Exercises posterior pelvic tilt with ab set      Shoulder Exercises: Standing   Other Standing Exercises --      Shoulder Exercises: ROM/Strengthening   Nustep 5 min L6 UE & LE      Shoulder Exercises: Stretch   Other Shoulder Stretches --  PT Education - 01/12/20 1151    Education Details LLD & heel lift, improving endurance    Person(s) Educated Patient    Methods Explanation    Comprehension Verbalized understanding;Need further instruction            PT Short Term Goals - 11/22/19 1457      PT SHORT TERM GOAL #1   Title Pt will be I and compliant with initial HEP.    Status Achieved      PT SHORT TERM GOAL #2   Title Pt will be able to walk for 5-10 minute at a time, to initiate an exercise routine.    Status Achieved      PT SHORT TERM GOAL #3   Title Pt will incr B cervical rotation to >/= 60 degees, in order to check blindspots when driving.    Baseline about 50 deg bilateral    Status On-going             PT Long Term Goals - 12/04/19 1331      PT LONG TERM GOAL #1   Title Pt will increase B shoulder MMT to 4/5, in order to improve participation in IADLs.    Status On-going    Target Date 01/18/20      PT LONG TERM GOAL #2   Title Pt will  reduce FOTO limitation to 52% disability or less to demonstrate a significant improvement in function.    Baseline 58%    Status On-going    Target Date 01/18/20      PT LONG TERM GOAL #3   Title Pt will report ability to go to grocery store and navigate at least half the store for increased participation in community activity.    Status On-going    Target Date 01/18/20      PT LONG TERM GOAL #4   Title Pt will be able to perform transfers with greater ease and less pain (sit to stand, car, supine to sit)    Time 6    Period Weeks    Status New    Target Date 01/18/20      PT LONG TERM GOAL #5   Title Pt will be able to lift 25 lbs from the floor with no increase in pain in back, neck    Time 6    Period Weeks    Status New    Target Date 01/18/20                 Plan - 01/12/20 1141    Clinical Impression Statement 3 layer heel lift placed in Rt shoe. will wear consistently through tuesday and then let me know how it feels.    PT Treatment/Interventions ADLs/Self Care Home Management;Cryotherapy;Spinal Manipulations;Joint Manipulations;Vasopneumatic Device;Passive range of motion;Dry needling;Taping;Scar mobilization;Manual techniques;Patient/family education;Therapeutic exercise;Ultrasound;Traction;Electrical Stimulation;Moist Heat;Neuromuscular re-education;Iontophoresis 4mg /ml Dexamethasone;Therapeutic activities;Functional mobility training    PT Next Visit Plan outcome of heel lift? progress core stability    PT Home Exercise Plan RWRXGEHE - Medbridge, added neck stretches and basic lumbar    Consulted and Agree with Plan of Care Patient           Patient will benefit from skilled therapeutic intervention in order to improve the following deficits and impairments:  Hypomobility, Decreased mobility, Decreased activity tolerance, Decreased strength, Increased fascial restricitons, Impaired flexibility, Postural dysfunction, Pain, Improper body mechanics, Impaired  perceived functional ability, Decreased scar mobility, Decreased range of motion  Visit Diagnosis: Status post bilateral breast reduction  Pain  in thoracic spine  Cervical pain (neck)     Problem List Patient Active Problem List   Diagnosis Date Noted  . Encounter for counseling 11/27/2019  . S/P bilateral breast reduction 06/19/2019  . Breast pain 04/24/2019  . Migraine 04/04/2018  . Back pain 04/04/2018  . Neck pain 04/04/2018  . Symptomatic mammary hypertrophy 04/04/2018  . Small fiber neuropathy 01/10/2017  . Neuropathy 10/20/2016  . Anxiety 10/12/2016  . Chronic left-sided low back pain 04/14/2016  . Paresthesia 02/09/2016  . Blurred vision 02/09/2016  . Neck pain on right side 02/09/2016  . Major depressive disorder, recurrent episode, moderate ([place]) 05/27/2015  . Diverticulitis 12/27/2014  . Myalgia 11/25/2014  . Hyperlipidemia 07/26/2014  . Hypertension 05/31/2014  . Exercise-induced asthma 02/21/2014  . GERD (gastroesophageal reflux disease) 02/21/2014  . Depression 02/20/2014   PHYSICAL THERAPY DISCHARGE SUMMARY  Visits from Start of Care: 7  Current functional level related to goals / functional outcomes: See above   Remaining deficits: See above   Education / Equipment: Anatomy of condiiton, POC, HEP, exercise form/rationale  Plan: Patient agrees to discharge.  Patient goals were partially met. Patient is being discharged due to financial reasons.  ?????     Marissa Lowrey C. Aleeyah Bensen PT, DPT 03/07/20 8:19 AM    [facility] [address] [place], [place], [address] Phone: [phone]   Fax:  [phone]  Name: EMALINA DUBREUIL MRN: [id number] Date of Birth: 03/15/1971

## 2020-01-15 ENCOUNTER — Ambulatory Visit: Payer: Medicare Other | Admitting: Physical Therapy

## 2020-01-22 ENCOUNTER — Ambulatory Visit: Payer: Medicare Other | Admitting: Physical Therapy

## 2020-01-22 ENCOUNTER — Telehealth: Payer: Self-pay | Admitting: Physical Therapy

## 2020-01-22 NOTE — Telephone Encounter (Signed)
LVM regarding NS today and advised of next appointment time. Requested call back if she needs to RS. Ceaser Ebeling C. Jordyne Poehlman PT, DPT 01/22/20 4:45 PM

## 2020-02-02 ENCOUNTER — Telehealth: Payer: Self-pay | Admitting: Physical Therapy

## 2020-02-02 ENCOUNTER — Ambulatory Visit: Payer: Medicare Other | Admitting: Physical Therapy

## 2020-02-02 NOTE — Telephone Encounter (Signed)
LVM regarding NS today- advised that she had a cancel and 2 no shows. Will be d/c at this time and requires new referral for re-evaluation should she need more PT. REquested she call us back with any questions.   Jamariah Tony C. Jenan Ellegood PT, DPT 02/02/20 12:50 PM

## 2020-03-04 ENCOUNTER — Encounter: Payer: Self-pay | Admitting: Plastic Surgery

## 2020-03-04 ENCOUNTER — Ambulatory Visit (INDEPENDENT_AMBULATORY_CARE_PROVIDER_SITE_OTHER): Payer: Medicare Other | Admitting: Plastic Surgery

## 2020-03-04 ENCOUNTER — Other Ambulatory Visit: Payer: Self-pay

## 2020-03-04 DIAGNOSIS — E881 Lipodystrophy, not elsewhere classified: Secondary | ICD-10-CM | POA: Diagnosis not present

## 2020-03-04 NOTE — Progress Notes (Signed)
Procedure Note  Preoperative Dx: Fat necrosis left breast  Postoperative Dx: Same  Procedure: Kenalog injection fat necrosis left breast  Anesthesia: Lidocaine 1% with 1:100,000 epinepherine   Description of Procedure: Risks and complications were explained to the patient.  Consent was confirmed and the patient understands the risks and benefits.  The potential complications and alternatives were explained and the patient consents.  The patient expressed understanding the option of not having the procedure and the risks of a scar.  Time out was called and all information was confirmed to be correct.    The area was prepped and drapped.  Lidocaine 1% with epinepherine 0.05 cc was mixed with Kenalog 93mb/5 ml 0.2 cc.  The mixture was then injected into the area of fat necrosis on the left breast.  It was adjacent to the nipple areola from the 1:00 to 3 o'clock position.  A dressing was applied.  The patient was given instructions on how to care for the area and a follow up appointment.  [name] tolerated the procedure well and there were no complications.  Continue massage and follow-up in 3 months.  Subjective:    Patient ID: Brooke Cruz, female    DOB: 20-Sep-1970, 49 y.o.   MRN: [id number]  The patient is a 49 year old female here for evaluation of her abdomen.  She underwent breast reduction in February.  She has some fat necrosis of the left breast from the 1:00 to 3 o'clock position.  She has been doing some massage but it has not resolved.  She is also interested in a panniculectomy or abdominoplasty.  She is 5 feet 2 inches tall weighs 180 pounds.  She states she has trouble bending over and thinks it is related to her pannus.  She has been trying to lose weight and its been very difficult.  I have not palpated a hernia or rectus diastases.     Review of Systems  Constitutional: Positive for activity change. Negative for appetite change.  HENT: Negative.   Eyes: Negative.     Respiratory: Negative.   Cardiovascular: Negative.   Gastrointestinal: Negative for abdominal distention.  Endocrine: Negative.   Genitourinary: Negative.   Musculoskeletal: Positive for back pain.  Psychiatric/Behavioral: Negative.        Objective:   Physical Exam Vitals and nursing note reviewed.  Constitutional:      Appearance: Normal appearance.  HENT:     Head: Normocephalic and atraumatic.  Cardiovascular:     Rate and Rhythm: Normal rate.     Pulses: Normal pulses.  Pulmonary:     Effort: Pulmonary effort is normal.  Abdominal:     General: Abdomen is flat. There is no distension.     Tenderness: There is no abdominal tenderness.     Hernia: No hernia is present.  Skin:    General: Skin is warm.     Capillary Refill: Capillary refill takes less than 2 seconds.  Neurological:     General: No focal deficit present.     Mental Status: She is alert and oriented to person, place, and time.  Psychiatric:        Mood and Affect: Mood normal.        Behavior: Behavior normal.       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  She is willing to go.  She is  also willing to see Dr. Luretha Murphy for a consult for gastric bypass.  Will plan to see her back in about 3 months to check on that as well as the fat necrosis of her breast.  Pictures were obtained of the patient and placed in the chart with the patient's or guardian's permission.

## 2020-04-24 ENCOUNTER — Telehealth: Payer: Self-pay

## 2020-04-24 NOTE — Telephone Encounter (Signed)
Patient called to get the phone number for [facility]...she said that we referred her to them, but she has not received a phone call.  I gave her their number.  Patient is interested in scheduling a follow-up appointment with Dr. Ulice Bold.  Patient said that she did talk with Dr. Ermalene Searing office and she found out that she does not fit the weight requirement for gastric bypass.  Should we wait until she has her appointment with [facility] to schedule her appointment with Dr. Ulice Bold?

## 2020-04-25 ENCOUNTER — Telehealth: Payer: Self-pay

## 2020-04-25 NOTE — Telephone Encounter (Signed)
Returned patients call. Advised her that she does not need to go the [facility] for insurance purpose.  Dr. Ulice Bold suggested for her to lose 5lbs and get her BMI to 30.She would like to try loosing weight by herself to save cost. Patient understood and agreed.

## 2020-04-25 NOTE — Telephone Encounter (Signed)
I called patient to relay message that she needs to complete [facility] before meeting again with Dr. Ulice Bold per Dr. Ulice Bold.  Patient would like to know what we would like her BMI to be.  Patient would like to avoid the cost of going to see [facility] if possible.  Please call.

## 2020-05-10 ENCOUNTER — Emergency Department (HOSPITAL_BASED_OUTPATIENT_CLINIC_OR_DEPARTMENT_OTHER)
Admission: EM | Admit: 2020-05-10 | Discharge: 2020-05-10 | Disposition: A | Discharge status: Home / Self Care | Payer: Medicare HMO | Attending: Emergency Medicine | Admitting: Emergency Medicine

## 2020-05-10 ENCOUNTER — Encounter (HOSPITAL_BASED_OUTPATIENT_CLINIC_OR_DEPARTMENT_OTHER): Payer: Self-pay | Admitting: Emergency Medicine

## 2020-05-10 ENCOUNTER — Other Ambulatory Visit: Payer: Self-pay

## 2020-05-10 ENCOUNTER — Emergency Department (HOSPITAL_BASED_OUTPATIENT_CLINIC_OR_DEPARTMENT_OTHER): Payer: Medicare HMO

## 2020-05-10 DIAGNOSIS — I10 Essential (primary) hypertension: Secondary | ICD-10-CM | POA: Diagnosis not present

## 2020-05-10 DIAGNOSIS — M542 Cervicalgia: Secondary | ICD-10-CM | POA: Diagnosis not present

## 2020-05-10 DIAGNOSIS — J45909 Unspecified asthma, uncomplicated: Secondary | ICD-10-CM | POA: Insufficient documentation

## 2020-05-10 DIAGNOSIS — R519 Headache, unspecified: Secondary | ICD-10-CM | POA: Diagnosis present

## 2020-05-10 DIAGNOSIS — Z79899 Other long term (current) drug therapy: Secondary | ICD-10-CM | POA: Insufficient documentation

## 2020-05-10 DIAGNOSIS — Z91040 Latex allergy status: Secondary | ICD-10-CM | POA: Insufficient documentation

## 2020-05-10 DIAGNOSIS — M25512 Pain in left shoulder: Secondary | ICD-10-CM | POA: Diagnosis not present

## 2020-05-10 LAB — BASIC METABOLIC PANEL
Anion gap: 12 (ref 5–15)
BUN: 10 mg/dL (ref 6–20)
CO2: 27 mmol/L (ref 22–32)
Calcium: 9 mg/dL (ref 8.9–10.3)
Chloride: 94 mmol/L — ABNORMAL LOW (ref 98–111)
Creatinine, Ser: 0.68 mg/dL (ref 0.44–1.00)
GFR, Estimated: >60 mL/min (ref >60)
Glucose, Bld: 93 mg/dL (ref 70–99)
Potassium: 3 mmol/L — ABNORMAL LOW (ref 3.5–5.1)
Sodium: 133 mmol/L — ABNORMAL LOW (ref 135–145)

## 2020-05-10 LAB — CBC
HCT: 40.3 % (ref 36.0–46.0)
Hemoglobin: 14.5 g/dL (ref 12.0–15.0)
MCH: 30.9 pg (ref 26.0–34.0)
MCHC: 36 g/dL (ref 30.0–36.0)
MCV: 85.7 fL (ref 80.0–100.0)
Platelets: 327 10*3/uL (ref 150–400)
RBC: 4.7 MIL/uL (ref 3.87–5.11)
RDW: 12.9 % (ref 11.5–15.5)
WBC: 7.4 10*3/uL (ref 4.0–10.5)
nRBC: 0 % (ref 0.0–0.2)

## 2020-05-10 LAB — TROPONIN I (HIGH SENSITIVITY)
Troponin I (High Sensitivity): 3 ng/L (ref <18)
Troponin I (High Sensitivity): 4 ng/L (ref <18)

## 2020-05-10 MED ORDER — IOHEXOL 350 MG/ML SOLN
100.0000 mL | Freq: Once | INTRAVENOUS | Status: AC | PRN
  Administered 2020-05-10: 100 mL via INTRAVENOUS

## 2020-05-10 MED ORDER — DIPHENHYDRAMINE HCL 50 MG/ML IJ SOLN
25.0000 mg | Freq: Once | INTRAMUSCULAR | Status: AC
  Administered 2020-05-10: 25 mg via INTRAVENOUS
  Filled 2020-05-10: qty 1

## 2020-05-10 MED ORDER — METOCLOPRAMIDE HCL 5 MG/ML IJ SOLN
10.0000 mg | Freq: Once | INTRAMUSCULAR | Status: AC
  Administered 2020-05-10: 10 mg via INTRAVENOUS
  Filled 2020-05-10: qty 2

## 2020-05-10 MED ORDER — FENTANYL CITRATE (PF) 100 MCG/2ML IJ SOLN
50.0000 ug | Freq: Once | INTRAMUSCULAR | Status: AC
Start: 2020-05-10 — End: 2020-05-10
  Administered 2020-05-10: 50 ug via INTRAVENOUS
  Filled 2020-05-10: qty 2

## 2020-05-10 MED ORDER — KETOROLAC TROMETHAMINE 30 MG/ML IJ SOLN
30.0000 mg | Freq: Once | INTRAMUSCULAR | Status: AC
  Administered 2020-05-10: 30 mg via INTRAVENOUS
  Filled 2020-05-10: qty 1

## 2020-05-10 NOTE — ED Triage Notes (Signed)
Pt arrives pov with c/o Hypertension and HA x 5 days. Pt endorses left neck and shoulder pain that started pta.

## 2020-05-10 NOTE — Discharge Instructions (Signed)
Please read and follow all provided instructions.  Your diagnoses today include:  1. Acute nonintractable headache, unspecified headache type   2. Neck pain   3. Acute pain of left shoulder     Tests performed today include:  CT of your head and neck which was normal and did not show any serious cause of your headache Blood cell counts (white, red, and platelets) Electrolytes  Kidney function test  Blood test for heart attack - negative  Chest x-ray - no problems  Vital signs. See below for your results today.   Medications:  In the Emergency Department you received:  Reglan - antinausea/headache medication  Benadryl - antihistamine to counteract potential side effects of reglan  Toradol - NSAID medication similar to ibuprofen  Take any prescribed medications only as directed.  Additional information:  Follow any educational materials contained in this packet.  You are having a headache. No specific cause was found today for your headache. It may have been a migraine or other cause of headache. Stress, anxiety, fatigue, and depression are common triggers for headaches.   Your headache today does not appear to be life-threatening or require hospitalization, but often the exact cause of headaches is not determined in the emergency department. Therefore, follow-up with your doctor is very important to find out what may have caused your headache and whether or not you need any further diagnostic testing or treatment.   Sometimes headaches can appear benign (not harmful), but then more serious symptoms can develop which should prompt an immediate re-evaluation by your doctor or the emergency department.  BE VERY CAREFUL not to take multiple medicines containing Tylenol (also called acetaminophen). Doing so can lead to an overdose which can damage your liver and cause liver failure and possibly death.   Follow-up instructions: Please follow-up with your primary care provider in  the next 3 days for further evaluation of your symptoms.   Return instructions:   Please return to the Emergency Department if you experience worsening symptoms.  Return if the medications do not resolve your headache, if it recurs, or if you have multiple episodes of vomiting or cannot keep down fluids.  Return if you have a change from the usual headache.  RETURN IMMEDIATELY IF you:  Develop a sudden, severe headache  Develop confusion or become poorly responsive or faint  Develop a fever above 100.14F or problem breathing  Have a change in speech, vision, swallowing, or understanding  Develop new weakness, numbness, tingling, incoordination in your arms or legs  Have a seizure  Please return if you have any other emergent concerns.  Additional Information:  Your vital signs today were: BP (!) 145/111 (BP Location: Right Arm)    Pulse 90    Temp 98.5 F (36.9 C)    Resp (!) 28    Ht 5\' 2"  (1.575 m)    Wt 81.6 kg    LMP 04/26/2020    SpO2 97%    BMI 32.92 kg/m  If your blood pressure (BP) was elevated above 135/85 this visit, please have this repeated by your doctor within one month. --------------

## 2020-05-10 NOTE — ED Notes (Signed)
VAN neg

## 2020-05-10 NOTE — ED Provider Notes (Signed)
[facility] EMERGENCY DEPARTMENT Provider Note   CSN: [id number] Arrival date & time: 05/10/20  1451     History Chief Complaint  Patient presents with  . Headache    Brooke Cruz is a 50 y.o. female.  Patient with remote history of migraine headaches, history of hypertension presents the emergency department for evaluation of headache, shoulder and chest pain, and high blood pressure. Patient states that around 1:00 today she developed a frontal headache with radiation to the back of her head. Symptoms began gradually and became worse. She checked her blood pressure and found that it was elevated so she took an extra amlodipine. At about 2:30 PM she reports cleaning out a drawer and she had acute onset of sharp left-sided shoulder and neck pain. She did not have any associated vision changes, syncope, or strokelike symptoms. She is not short of breath or having any difficulty with breathing. Pain is not really changed with movement or deep breathing. She has not had pain like this in the past, prompting emergency department visit. She has taken ibuprofen prior to arrival. She denies recent injuries or manipulations. Pain is not worse with movement of her head or neck. No weakness, numbness, or tingling in the arms or legs. She was able to walk without difficulty.        Past Medical History:  Diagnosis Date  . Anxiety and depression   . Arthritis   . Asthma    exercise induced  . Chronic back pain   . Depression   . Diverticulitis   . GERD (gastroesophageal reflux disease)   . Hyperlipemia   . Hypertension   . Peripheral neuropathy   . PTSD (post-traumatic stress disorder)     Patient Active Problem List   Diagnosis Date Noted  . 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 Encounter for counseling 11/27/2019  . S/P bilateral breast reduction 06/19/2019  . Breast pain 04/24/2019  . Migraine 04/04/2018  . Back pain 04/04/2018  . Neck pain 04/04/2018  . Symptomatic  mammary hypertrophy 04/04/2018  . Small fiber neuropathy 01/10/2017  . Neuropathy 10/20/2016  . Anxiety 10/12/2016  . Chronic left-sided low back pain 04/14/2016  . Paresthesia 02/09/2016  . Blurred vision 02/09/2016  . Neck pain on right side 02/09/2016  . Major depressive disorder, recurrent episode, moderate (HCC) 05/27/2015  . Diverticulitis 12/27/2014  . Myalgia 11/25/2014  . Hyperlipidemia 07/26/2014  . Hypertension 05/31/2014  . Exercise-induced asthma 02/21/2014  . GERD (gastroesophageal reflux disease) 02/21/2014  . Depression 02/20/2014    Past Surgical History:  Procedure Laterality Date  . BREAST REDUCTION SURGERY Bilateral 05/24/2019   Procedure: mammary reduction;  Surgeon: Peggye Form, DO;  Location: [facility];  Service: Plastics;  Laterality: Bilateral;  3.5 hours  . CESAREAN SECTION     x 2  . NASAL SINUS SURGERY       OB History    Gravida  6   Para  3   Term      Preterm      AB  2   Living        SAB  2   IAB      Ectopic      Multiple      Live Births              Family History  Problem Relation Age of Onset  . Hypertension Mother   . Fibromyalgia Mother   . Schizophrenia Father   . Schizophrenia Sister   .  Colon cancer Maternal Grandmother   . Heart disease Maternal Grandfather   . Breast cancer Maternal Aunt     Social History   Tobacco Use  . Smoking status: Never Smoker  . Smokeless tobacco: Never Used  Substance Use Topics  . Alcohol use: No  . Drug use: Yes    Types: Other-see comments, Marijuana    Comment: THC occasionally, last 05-23-19    Home Medications Prior to Admission medications   Medication Sig Start Date End Date Taking? Authorizing Provider  amLODIPine Besylate (NORVASC PO) Take 10 mg by mouth daily.    [provider]  busPIRone (BUSPAR) 10 MG tablet Take 10 mg by mouth 2 (two) times daily.    [provider]  carbamazepine (CARBATROL) 100 MG 12 hr  capsule Take 200 mg by mouth 2 (two) times daily as needed.  03/29/19   [provider]  celecoxib (CELEBREX) 200 MG capsule Take 200 mg by mouth daily. 02/22/19   [provider]  cetirizine (ZYRTEC) 10 MG tablet Take by mouth daily.    [provider]  citalopram (CELEXA) 40 MG tablet daily. 07/29/15   [provider]  cyclobenzaprine (FLEXERIL) 10 MG tablet Take 1 tablet (10 mg total) by mouth 3 (three) times daily as needed. 04/14/16   Levert Feinstein, MD  DULoxetine (CYMBALTA) 60 MG capsule Take 1 capsule (60 mg total) by mouth daily. 07/30/16   Levert Feinstein, MD  EPINEPHrine (EPIPEN) 0.3 mg/0.3 mL DEVI Inject 0.3 mLs (0.3 mg total) into the muscle as needed. 04/04/12   Zadie Rhine, MD  gabapentin (NEURONTIN) 600 MG tablet Take 600 mg by mouth daily.  02/22/19   [provider]  gabapentin (NEURONTIN) 800 MG tablet Take 800 mg by mouth at bedtime. 03/22/19   [provider]  hydrochlorothiazide (HYDRODIURIL) 25 MG tablet Take 25 mg by mouth 2 (two) times daily.    [provider]  HYDROcodone-acetaminophen (NORCO/VICODIN) 5-325 MG tablet Take 1 tablet by mouth every 6 (six) hours as needed. 04/17/17   Charlynne Pander, MD  ibuprofen (ADVIL) 600 MG tablet Take 1 tablet (600 mg total) by mouth every 6 (six) hours as needed for moderate pain. 05/08/19   Young, Johanna C, PA-C  icosapent Ethyl (VASCEPA) 1 g capsule Take 2 g by mouth 2 (two) times daily.    [provider]  ondansetron (ZOFRAN) 4 MG tablet Take 1 tablet (4 mg total) by mouth every 8 (eight) hours as needed for nausea or vomiting. 05/08/19   Young, Johanna C, PA-C  potassium chloride SA (KLOR-CON) 20 MEQ tablet Take 1 tablet (20 mEq total) by mouth daily. 01/07/19   Jacalyn Lefevre, MD  predniSONE (DELTASONE) 20 MG tablet Take 60 mg daily x 2 days then 40 mg daily x 2 days then 20 mg daily x 2 days 04/17/17   Charlynne Pander, MD  triamcinolone cream (KENALOG) 0.1 % Apply  1 application topically 2 (two) times daily. Avoid incisions. 07/03/19   Young, Johanna C, PA-C  VITAMIN D PO Take by mouth once a week.    [provider]    Allergies    Influenza vaccines, Eggs or egg-derived products, Latex, Onion, Other, Sulfa antibiotics, and Corn oil  Review of Systems   Review of Systems  Constitutional: Negative for diaphoresis and fever.  HENT: Negative for congestion, dental problem, rhinorrhea and sinus pressure.   Eyes: Positive for photophobia. Negative for discharge, redness and visual disturbance.  Respiratory: Negative for  cough and shortness of breath.   Cardiovascular: Positive for chest pain. Negative for palpitations and leg swelling.  Gastrointestinal: Negative for abdominal pain, nausea and vomiting.  Genitourinary: Negative for dysuria.  Musculoskeletal: Positive for neck pain. Negative for back pain, gait problem and neck stiffness.  Skin: Negative for rash.  Neurological: Positive for headaches. Negative for syncope, speech difficulty, weakness, light-headedness and numbness.  Psychiatric/Behavioral: Negative for confusion. The patient is not nervous/anxious.     Physical Exam Updated Vital Signs BP 127/77 (BP Location: Right Arm)   Pulse 97   Temp 98.5 F (36.9 C)   Resp 20   Ht 5\' 2"  (1.575 m)   Wt 81.6 kg   LMP 04/26/2020   SpO2 99%   BMI 32.92 kg/m   Physical Exam Vitals and nursing note reviewed.  Constitutional:      General: She is not in acute distress.    Appearance: She is well-developed and well-nourished.  HENT:     Head: Normocephalic and atraumatic.     Right Ear: Tympanic membrane, ear canal and external ear normal.     Left Ear: Tympanic membrane, ear canal and external ear normal.     Nose: Nose normal.     Mouth/Throat:     Mouth: Oropharynx is clear and moist and mucous membranes are normal.     Pharynx: Uvula midline.  Eyes:     General: Lids are normal.     Extraocular Movements: EOM normal.      Right eye: No nystagmus.     Left eye: No nystagmus.     Conjunctiva/sclera: Conjunctivae normal.     Pupils: Pupils are equal, round, and reactive to light.  Neck:     Comments: No bruits in the neck. 2+ carotid pulses bilaterally. Cardiovascular:     Rate and Rhythm: Normal rate and regular rhythm.     Heart sounds: No murmur heard.   Pulmonary:     Effort: Pulmonary effort is normal. No respiratory distress.     Breath sounds: Normal breath sounds. No wheezing, rhonchi or rales.  Abdominal:     Palpations: Abdomen is soft.     Tenderness: There is no abdominal tenderness. There is no guarding or rebound.  Musculoskeletal:     Cervical back: Normal range of motion and neck supple. No tenderness or bony tenderness. Normal range of motion.     Right lower leg: No edema.     Left lower leg: No edema.  Skin:    General: Skin is warm and dry.     Findings: No rash.  Neurological:     General: No focal deficit present.     Mental Status: She is alert and oriented to person, place, and time. Mental status is at baseline.     GCS: GCS eye subscore is 4. GCS verbal subscore is 5. GCS motor subscore is 6.     Cranial Nerves: No cranial nerve deficit.     Sensory: No sensory deficit.     Motor: No weakness.     Coordination: She displays a negative Romberg sign. Coordination normal.     Gait: Gait normal.     Deep Tendon Reflexes: Strength normal.  Psychiatric:        Mood and Affect: Mood and affect and mood normal.     ED Results / Procedures / Treatments   Labs (all labs ordered are listed, but only abnormal results are displayed) Labs Reviewed  BASIC METABOLIC PANEL - Abnormal; Notable  for the following components:      Result Value   Sodium 133 (*)    Potassium 3.0 (*)    Chloride 94 (*)    All other components within normal limits  CBC  TROPONIN I (HIGH SENSITIVITY)  TROPONIN I (HIGH SENSITIVITY)    EKG EKG Interpretation  Date/Time:  Saturday May 10 2020  15:01:44 EST Ventricular Rate:  94 PR Interval:    QRS Duration: 96 QT Interval:  361 QTC Calculation: 452 R Axis:   61 Text Interpretation: Sinus rhythm No significant change since last tracing Confirmed by Cathren Laine ([id number]) on 05/10/2020 3:15:08 PM   Radiology DG Chest 2 View  Result Date: 05/10/2020 CLINICAL DATA:  Left shoulder and chest pain. EXAM: CHEST - 2 VIEW COMPARISON:  January 07, 2019 FINDINGS: Cardiomediastinal silhouette is normal. Mediastinal contours appear intact. Mild streaky airspace opacities in bilateral lung bases, left greater than right. Osseous structures are without acute abnormality. Soft tissues are grossly normal. IMPRESSION: Mild streaky airspace opacities in bilateral lung bases, left greater than right, may represent atypical pneumonia versus atelectasis. Electronically Signed   By: Ted Mcalpine M.D.   On: 05/10/2020 16:16    Procedures Procedures   Medications Ordered in ED Medications  metoCLOPramide (REGLAN) injection 10 mg (10 mg Intravenous Given 05/10/20 1620)  diphenhydrAMINE (BENADRYL) injection 25 mg (25 mg Intravenous Given 05/10/20 1620)  iohexol (OMNIPAQUE) 350 MG/ML injection 100 mL (100 mLs Intravenous Contrast Given 05/10/20 1758)  ketorolac (TORADOL) 30 MG/ML injection 30 mg (30 mg Intravenous Given 05/10/20 1927)  fentaNYL (SUBLIMAZE) injection 50 mcg (50 mcg Intravenous Given 05/10/20 1927)    ED Course  I have reviewed the triage vital signs and the nursing notes.  Pertinent labs & imaging results that were available during my care of the patient were reviewed by me and considered in my medical decision making (see chart for details).  Patient seen and examined. Regards to the headache, not thunderclap onset. Debating need for managing. No strokelike symptoms. She states that her gradually worsened and she has a normal neuro exam. It is difficult to know exactly what is causing her shoulder and neck pain. Will check labs, give migraine  cocktail, and check a chest x-ray. EKG is normal. Will reassess to determine need for head/neck imaging. Overall low concern for dissection in the neck, however on differential.  Vital signs reviewed and are as follows: BP 127/77 (BP Location: Right Arm)   Pulse 97   Temp 98.5 F (36.9 C)   Resp 20   Ht 5\' 2"  (1.575 m)   Wt 81.6 kg   LMP 04/26/2020   SpO2 99%   BMI 32.92 kg/m   5:43 PM patient rechecked.  She continues to complain of significant headache.  She states that the neck and shoulder pain are improved.  She denies improvement with migraine cocktail.  Discussed imaging of the head and neck to rule out vascular dissection as cause of the patient's symptoms.  Patient agrees to proceed.  K 3.0 noted.   7:36 PM Pt stable. Updated on results of CT studies.   Fentanyl/toradol ordered for pain. Pt does not appear to be in distress.   Will d/c. Encouraged PCP f/u for recheck next week. Patient counseled to return if they have weakness in their arms or legs, slurred speech, trouble walking or talking, confusion, trouble with their balance, or if they have any other concerns. Patient verbalizes understanding and agrees with plan.     Clinical  Course as of 05/10/20 1742  Sat May 10, 2020  1738 CT Angio Head W or Wo Contrast [KC]  1738 CT Angio Neck W and/or Wo Contrast [KC]  1738 CT Angio Head W or Wo Contrast [KC]    Clinical Course User Index [KC] Corless, Rozann Lesches, Student-PA   MDM Rules/Calculators/A&P                          Patient presents with severe atypical headache, left neck pain and shoulder pain.  Red flags include atypical headache, age 43 or greater, neck and shoulder pain.  Patient without other high-risk features of headache including: sudden onset/thunderclap HA, altered mental status, accompanying seizure, headache with exertion, history of immunocompromise, fever, use of anticoagulation, family history of spontaneous SAH, concomitant drug use, toxic exposure.    Patient has a normal complete neurological exam, normal vital signs, normal level of consciousness, no signs of meningismus, is well-appearing/non-toxic appearing, no signs of trauma.  CT imaging performed of the neck and head to evaluate for bleeding, vascular dissection which would explain constellation of symptoms.  Fortunately this was negative.  Patient was also evaluated for cardiac etiology with troponin x2, chest x-ray, EKG.  Cardiac work-up was reassuring.  No dangerous or life-threatening conditions suspected or identified by history, physical exam, and by work-up. No indications for hospitalization identified.    Final Clinical Impression(s) / ED Diagnoses Final diagnoses:  Acute nonintractable headache, unspecified headache type  Neck pain  Acute pain of left shoulder    Rx / DC Orders ED Discharge Orders    None       Renne Crigler, Cordelia Poche 05/10/20 1939    Cathren Laine, MD 05/10/20 2116

## 2020-05-28 ENCOUNTER — Other Ambulatory Visit: Payer: Self-pay

## 2020-05-28 ENCOUNTER — Ambulatory Visit (INDEPENDENT_AMBULATORY_CARE_PROVIDER_SITE_OTHER): Payer: Medicare HMO | Admitting: Family Medicine

## 2020-05-28 ENCOUNTER — Encounter (INDEPENDENT_AMBULATORY_CARE_PROVIDER_SITE_OTHER): Payer: Self-pay | Admitting: Family Medicine

## 2020-05-28 VITALS — BP 143/82 | HR 87 | Temp 98.6°F | Ht 62.0 in | Wt 179.0 lb

## 2020-05-28 DIAGNOSIS — Z0289 Encounter for other administrative examinations: Secondary | ICD-10-CM

## 2020-05-28 DIAGNOSIS — R739 Hyperglycemia, unspecified: Secondary | ICD-10-CM

## 2020-05-28 DIAGNOSIS — E782 Mixed hyperlipidemia: Secondary | ICD-10-CM

## 2020-05-28 DIAGNOSIS — E559 Vitamin D deficiency, unspecified: Secondary | ICD-10-CM | POA: Diagnosis not present

## 2020-05-28 DIAGNOSIS — Z6832 Body mass index (BMI) 32.0-32.9, adult: Secondary | ICD-10-CM

## 2020-05-28 DIAGNOSIS — R0602 Shortness of breath: Secondary | ICD-10-CM | POA: Diagnosis not present

## 2020-05-28 DIAGNOSIS — F32A Depression, unspecified: Secondary | ICD-10-CM

## 2020-05-28 DIAGNOSIS — I10 Essential (primary) hypertension: Secondary | ICD-10-CM

## 2020-05-28 DIAGNOSIS — G4733 Obstructive sleep apnea (adult) (pediatric): Secondary | ICD-10-CM

## 2020-05-28 DIAGNOSIS — E669 Obesity, unspecified: Secondary | ICD-10-CM

## 2020-05-28 DIAGNOSIS — R5383 Other fatigue: Secondary | ICD-10-CM

## 2020-05-28 DIAGNOSIS — F419 Anxiety disorder, unspecified: Secondary | ICD-10-CM

## 2020-05-29 LAB — CBC WITH DIFFERENTIAL/PLATELET
Basophils Absolute: 0 10*3/uL (ref 0.0–0.2)
Basos: 0 %
EOS (ABSOLUTE): 0.2 10*3/uL (ref 0.0–0.4)
Eos: 2 %
Hematocrit: 40.4 % (ref 34.0–46.6)
Hemoglobin: 13.6 g/dL (ref 11.1–15.9)
Immature Grans (Abs): 0 10*3/uL (ref 0.0–0.1)
Immature Granulocytes: 0 %
Lymphocytes Absolute: 2.7 10*3/uL (ref 0.7–3.1)
Lymphs: 29 %
MCH: 30.2 pg (ref 26.6–33.0)
MCHC: 33.7 g/dL (ref 31.5–35.7)
MCV: 90 fL (ref 79–97)
Monocytes Absolute: 0.7 10*3/uL (ref 0.1–0.9)
Monocytes: 7 %
Neutrophils Absolute: 5.8 10*3/uL (ref 1.4–7.0)
Neutrophils: 62 %
Platelets: 347 10*3/uL (ref 150–450)
RBC: 4.5 x10E6/uL (ref 3.77–5.28)
RDW: 13.6 % (ref 11.7–15.4)
WBC: 9.4 10*3/uL (ref 3.4–10.8)

## 2020-05-29 LAB — VITAMIN D 25 HYDROXY (VIT D DEFICIENCY, FRACTURES): Vit D, 25-Hydroxy: 34.2 ng/mL (ref 30.0–100.0)

## 2020-05-29 LAB — COMPREHENSIVE METABOLIC PANEL
ALT: 22 IU/L (ref 0–32)
AST: 22 IU/L (ref 0–40)
Albumin/Globulin Ratio: 2.5 — ABNORMAL HIGH (ref 1.2–2.2)
Albumin: 4.5 g/dL (ref 3.8–4.8)
Alkaline Phosphatase: 64 IU/L (ref 44–121)
BUN/Creatinine Ratio: 18 (ref 9–23)
BUN: 9 mg/dL (ref 6–24)
Bilirubin Total: <0.2 mg/dL (ref 0.0–1.2)
CO2: 23 mmol/L (ref 20–29)
Calcium: 9.4 mg/dL (ref 8.7–10.2)
Chloride: 90 mmol/L — ABNORMAL LOW (ref 96–106)
Creatinine, Ser: 0.5 mg/dL — ABNORMAL LOW (ref 0.57–1.00)
GFR calc Af Amer: 131 mL/min/{1.73_m2} (ref >59)
GFR calc non Af Amer: 113 mL/min/{1.73_m2} (ref >59)
Globulin, Total: 1.8 g/dL (ref 1.5–4.5)
Glucose: 83 mg/dL (ref 65–99)
Potassium: 3.4 mmol/L — ABNORMAL LOW (ref 3.5–5.2)
Sodium: 131 mmol/L — ABNORMAL LOW (ref 134–144)
Total Protein: 6.3 g/dL (ref 6.0–8.5)

## 2020-05-29 LAB — INSULIN, RANDOM: INSULIN: 24 u[IU]/mL (ref 2.6–24.9)

## 2020-05-29 LAB — LIPID PANEL WITH LDL/HDL RATIO
Cholesterol, Total: 186 mg/dL (ref 100–199)
HDL: 56 mg/dL (ref >39)
LDL Chol Calc (NIH): 94 mg/dL (ref 0–99)
LDL/HDL Ratio: 1.7 ratio (ref 0.0–3.2)
Triglycerides: 213 mg/dL — ABNORMAL HIGH (ref 0–149)
VLDL Cholesterol Cal: 36 mg/dL (ref 5–40)

## 2020-05-29 LAB — T4: T4, Total: 4.7 ug/dL (ref 4.5–12.0)

## 2020-05-29 LAB — T3: T3, Total: 92 ng/dL (ref 71–180)

## 2020-05-29 LAB — TSH: TSH: 2.66 u[IU]/mL (ref 0.450–4.500)

## 2020-05-29 LAB — HEMOGLOBIN A1C
Est. average glucose Bld gHb Est-mCnc: 114 mg/dL
Hgb A1c MFr Bld: 5.6 % (ref 4.8–5.6)

## 2020-06-02 NOTE — Progress Notes (Signed)
Dear Dr. Ulice Bold,   Thank you for referring Brooke Cruz to our clinic. The following note includes my evaluation and treatment recommendations.  Chief Complaint:   OBESITY Brooke Cruz (MR# [id number]) is a 50 y.o. female who presents for evaluation and treatment of obesity and related comorbidities. Current BMI is Body mass index is 32.74 kg/m. Brooke Cruz has been struggling with her weight for many years and has been unsuccessful in either losing weight, maintaining weight loss, or reaching her healthy weight goal.  Brooke Cruz is currently in the action stage of change and ready to dedicate time achieving and maintaining a healthier weight. Brooke Cruz is interested in becoming our patient and working on intensive lifestyle modifications including (but not limited to) diet and exercise for weight loss.  Brooke Cruz was referred by Dr. Ulice Bold. She tolerated small amounts of lactose free produce. She typically skips breakfast- may eat crust of PB&J sandwich. If she eats lunch, it may be crackers with cheddar cheese and diet soda (8-10 crackers) (pre-cut cheese). She may snack again or just doesn't eat until dinner. Dinner- spaghetti (1.5 cups) +/- meat sauce (feel full); After dinner- nachos or 1/2 wrap.   Brooke Cruz's habits were reviewed today and are as follows: Her family eats meals together, she struggles with family and or coworkers weight loss sabotage, her desired weight loss is 49 lbs, she started gaining weight during divorce and pandemic in tandem, her heaviest weight ever was 184 pounds, she snacks frequently in the evenings, she wakes up frequently in the middle of the night to eat, she skips meals frequently, she is frequently drinking liquids with calories, she frequently makes poor food choices, she has problems with excessive hunger, she frequently eats larger portions than normal, she has binge eating behaviors and she struggles with emotional eating.  Depression  Screen Brooke Cruz's Food and Mood (modified PHQ-9) score was 17.  Depression screen [facility] 2/9 05/28/2020  Decreased Interest 3  Down, Depressed, Hopeless 3  PHQ - 2 Score 6  Altered sleeping 3  Tired, decreased energy 3  Change in appetite 3  Feeling bad or failure about yourself  0  Trouble concentrating 2  Moving slowly or fidgety/restless 0  Suicidal thoughts 0  PHQ-9 Score 17  Difficult doing work/chores Somewhat difficult   Subjective:   1. Other fatigue Brooke Cruz admits to daytime somnolence and admits to waking up still tired. Patent has a history of symptoms of morning headache. Brooke Cruz generally gets 4-6 hours of sleep per night, and states that she has poor sleep quality. Snoring is present. Apneic episodes are present. Epworth Sleepiness Score is 18. EKG normal sinus rhythm at 94 bpm.  2. SOB (shortness of breath) on exertion Brooke Cruz notes increasing shortness of breath with exercising and seems to be worsening over time with weight gain. She notes getting out of breath sooner with activity than she used to. This has gotten worse recently. Brooke Cruz denies shortness of breath at rest or orthopnea. EKG normal sinus rhythm at 94 bpm.  3. Mixed hyperlipidemia Brooke Cruz's LDL was previously 219. She is taking pravastatin.   4. Vitamin D deficiency Brooke Cruz has a history of Vit D diagnosis. She is not on Vit D supplementation.  5. Hyperglycemia Brooke Cruz has had elevated blood sugars in the past. She states there is a history of pre-diabetes in past.  6. OSA (obstructive sleep apnea) Brooke Cruz just got her CPAP and is still getting used to it.  7. Essential hypertension Brooke Cruz has had a  diagnosis of hypertension for quite a few years. She is on amlodipine (previously on lisinopril). BP slightly elevated today.   8. Anxiety and depression Brooke Cruz is managed by Tamela Oddi, NP at [facility]. She is on Cymbalta, Celexa, and Ativan. She is doing frequent  emotional eating and is starting to gain awareness prior to eating.  Assessment/Plan:   1. Other fatigue Brooke Cruz does feel that her weight is causing her energy to be lower than it should be. Fatigue may be related to obesity, depression or many other causes. Labs will be ordered, and in the meanwhile, Brooke Cruz will focus on self care including making healthy food choices, increasing physical activity and focusing on stress reduction. Check labs today.  - T3 - T4 - TSH - CBC with Differential/Platelet  2. SOB (shortness of breath) on exertion Brooke Cruz does feel that she gets out of breath more easily that she used to when she exercises. Belvie's shortness of breath appears to be obesity related and exercise induced. She has agreed to work on weight loss and gradually increase exercise to treat her exercise induced shortness of breath. Will continue to monitor closely.  3. Mixed hyperlipidemia Cardiovascular risk and specific lipid/LDL goals reviewed.  We discussed several lifestyle modifications today and Brooke Cruz will continue to work on diet, exercise and weight loss efforts. Orders and follow up as documented in patient record. Check labs today.  Counseling Intensive lifestyle modifications are the first line treatment for this issue. . Dietary changes: Increase soluble fiber. Decrease simple carbohydrates. . Exercise changes: Moderate to vigorous-intensity aerobic activity 150 minutes per week if tolerated. . Lipid-lowering medications: see documented in medical record.  - Lipid Panel With LDL/HDL Ratio  4. Vitamin D deficiency Low Vitamin D level contributes to fatigue and are associated with obesity, breast, and colon cancer. She agrees to follow-up for routine testing of Vitamin D, at least 2-3 times per year to avoid over-replacement. Check labs today.  - VITAMIN D 25 Hydroxy (Vit-D Deficiency, Fractures)  5. Hyperglycemia Fasting labs will be obtained and results with be  discussed with Brooke Cruz in 2 weeks at her follow up visit. In the meanwhile Brooke Cruz was started on a lower simple carbohydrate diet and will work on weight loss efforts. Check labs today.  - Hemoglobin A1c - Insulin, random  6. OSA (obstructive sleep apnea) Intensive lifestyle modifications are the first line treatment for this issue. We discussed several lifestyle modifications today and she will continue to work on diet, exercise and weight loss efforts. We will continue to monitor. Orders and follow up as documented in patient record. Follow up on compliance at next OV.  7. Essential hypertension Brooke Cruz is working on healthy weight loss and exercise to improve blood pressure control. We will watch for signs of hypotension as she continues her lifestyle modifications. Check labs today.  - Comprehensive metabolic panel  8. Anxiety and depression Behavior modification techniques were discussed today to help Brooke Cruz deal with her anxiety.  Orders and follow up as documented in patient record. Follow up with psych NP. Refer to Dr. Dewaine Conger.  9. Class 1 obesity with serious comorbidity and body mass index (BMI) of 32.0 to 32.9 in adult, unspecified obesity type Brooke Cruz is currently in the action stage of change and her goal is to continue with weight loss efforts. I recommend Brooke Cruz begin the structured treatment plan as follows:  She has agreed to the Category 3 Plan + 100 and keeping a food journal and adhering  to recommended goals of 1550-1700 calories and 100+ g protein.  Exercise goals: No exercise has been prescribed at this time.   Behavioral modification strategies: increasing lean protein intake, meal planning and cooking strategies, keeping healthy foods in the home and planning for success.  She was informed of the importance of frequent follow-up visits to maximize her success with intensive lifestyle modifications for her multiple health conditions. She was informed we would  discuss her lab results at her next visit unless there is a critical issue that needs to be addressed sooner. Brooke Cruz agreed to keep her next visit at the agreed upon time to discuss these results.  Objective:   Blood pressure (!) 143/82, pulse 87, temperature 98.6 F (37 C), temperature source Oral, height 5\' 2"  (1.575 m), weight 179 lb (81.2 kg), last menstrual period 05/21/2020, SpO2 100 %. Body mass index is 32.74 kg/m.  EKG: Normal sinus rhythm, rate 94.  Indirect Calorimeter completed today shows a VO2 of 290 and a REE of 2020.  Her calculated basal metabolic rate is 05/23/2020 thus her basal metabolic rate is better than expected.  General: Cooperative, alert, well developed, in no acute distress. HEENT: Conjunctivae and lids unremarkable. Cardiovascular: Regular rhythm.  Lungs: Normal work of breathing. Neurologic: No focal deficits.   Lab Results  Component Value Date   CREATININE 0.50 (L) 05/28/2020   BUN 9 05/28/2020   NA 131 (L) 05/28/2020   K 3.4 (L) 05/28/2020   CL 90 (L) 05/28/2020   CO2 23 05/28/2020   Lab Results  Component Value Date   ALT 22 05/28/2020   AST 22 05/28/2020   ALKPHOS 64 05/28/2020   BILITOT <0.2 05/28/2020   Lab Results  Component Value Date   HGBA1C 5.6 05/28/2020   HGBA1C 5.4 02/09/2016   Lab Results  Component Value Date   INSULIN 24.0 05/28/2020   Lab Results  Component Value Date   TSH 2.660 05/28/2020   Lab Results  Component Value Date   CHOL 186 05/28/2020   HDL 56 05/28/2020   LDLCALC 94 05/28/2020   TRIG 213 (H) 05/28/2020   Lab Results  Component Value Date   WBC 9.4 05/28/2020   HGB 13.6 05/28/2020   HCT 40.4 05/28/2020   MCV 90 05/28/2020   PLT 347 05/28/2020   Lab Results  Component Value Date   FERRITIN 95 02/09/2016    Attestation Statements:   Reviewed by clinician on day of visit: allergies, medications, problem list, medical history, surgical history, family history, social history, and previous  encounter notes.  Time spent on visit including pre-visit chart review and post-visit charting and care was 60 minutes.   13/09/2015, am acting as transcriptionist for Edmund Hilda, MD.  This is the patient's first visit at [facility]. The patient's NEW PATIENT PACKET was reviewed at length. Included in the packet: current and past health history, medications, allergies, ROS, gynecologic history (women only), surgical history, family history, social history, weight history, weight loss surgery history (for those that have had weight loss surgery), nutritional evaluation, mood and food questionnaire, PHQ9, Epworth questionnaire, sleep habits questionnaire, patient life and health improvement goals questionnaire. These will all be scanned into the patient's chart under media.   During the visit, I independently reviewed the patient's EKG, bioimpedance scale results, and indirect calorimeter results. I used this information to tailor a meal plan for the patient that will help her to lose weight and will improve her obesity-related conditions going  forward. I performed a medically necessary appropriate examination and/or evaluation. I discussed the assessment and treatment plan with the patient. The patient was provided an opportunity to ask questions and all were answered. The patient agreed with the plan and demonstrated an understanding of the instructions. Labs were ordered at this visit and will be reviewed at the next visit unless more critical results need to be addressed immediately. Clinical information was updated and documented in the EMR.   Time spent on visit including pre-visit chart review and post-visit care was 60  minutes.   A separate 15 minutes was spent on risk counseling (see above).  I have reviewed the above documentation for accuracy and completeness, and I agree with the above. - Katherina MiresAlexandria Deep Bonawitz Kadolph, MD

## 2020-06-11 ENCOUNTER — Ambulatory Visit (INDEPENDENT_AMBULATORY_CARE_PROVIDER_SITE_OTHER): Payer: Self-pay | Admitting: Family Medicine

## 2020-07-04 ENCOUNTER — Ambulatory Visit: Payer: Medicare Other | Admitting: Plastic Surgery

## 2020-11-14 ENCOUNTER — Encounter (HOSPITAL_BASED_OUTPATIENT_CLINIC_OR_DEPARTMENT_OTHER): Payer: Self-pay

## 2020-11-14 ENCOUNTER — Emergency Department (HOSPITAL_BASED_OUTPATIENT_CLINIC_OR_DEPARTMENT_OTHER)
Admission: EM | Admit: 2020-11-14 | Discharge: 2020-11-14 | Disposition: A | Discharge status: Home / Self Care | Payer: Medicare HMO | Attending: Emergency Medicine | Admitting: Emergency Medicine

## 2020-11-14 ENCOUNTER — Other Ambulatory Visit: Payer: Self-pay

## 2020-11-14 DIAGNOSIS — R319 Hematuria, unspecified: Secondary | ICD-10-CM | POA: Diagnosis not present

## 2020-11-14 DIAGNOSIS — R109 Unspecified abdominal pain: Secondary | ICD-10-CM | POA: Diagnosis not present

## 2020-11-14 DIAGNOSIS — Z5321 Procedure and treatment not carried out due to patient leaving prior to being seen by health care provider: Secondary | ICD-10-CM | POA: Diagnosis not present

## 2020-11-14 NOTE — ED Notes (Signed)
After triage, pt advised of wait states she is leaving and will go to [facility] tomorrow-NAD-steady gait

## 2020-11-14 NOTE — ED Triage Notes (Signed)
Pt c/o right flank pain, hematuria x today-NAD-steady gait

## 2020-12-01 ENCOUNTER — Emergency Department (HOSPITAL_BASED_OUTPATIENT_CLINIC_OR_DEPARTMENT_OTHER)
Admission: EM | Admit: 2020-12-01 | Discharge: 2020-12-01 | Disposition: A | Discharge status: Home / Self Care | Payer: Medicare HMO | Attending: Emergency Medicine | Admitting: Emergency Medicine

## 2020-12-01 ENCOUNTER — Encounter (HOSPITAL_BASED_OUTPATIENT_CLINIC_OR_DEPARTMENT_OTHER): Payer: Self-pay

## 2020-12-01 ENCOUNTER — Emergency Department (HOSPITAL_BASED_OUTPATIENT_CLINIC_OR_DEPARTMENT_OTHER): Payer: Medicare HMO

## 2020-12-01 ENCOUNTER — Other Ambulatory Visit: Payer: Self-pay

## 2020-12-01 DIAGNOSIS — J45909 Unspecified asthma, uncomplicated: Secondary | ICD-10-CM | POA: Diagnosis not present

## 2020-12-01 DIAGNOSIS — R1011 Right upper quadrant pain: Secondary | ICD-10-CM | POA: Diagnosis not present

## 2020-12-01 DIAGNOSIS — I10 Essential (primary) hypertension: Secondary | ICD-10-CM | POA: Insufficient documentation

## 2020-12-01 DIAGNOSIS — M542 Cervicalgia: Secondary | ICD-10-CM | POA: Insufficient documentation

## 2020-12-01 DIAGNOSIS — R1013 Epigastric pain: Secondary | ICD-10-CM | POA: Diagnosis not present

## 2020-12-01 DIAGNOSIS — R519 Headache, unspecified: Secondary | ICD-10-CM | POA: Diagnosis not present

## 2020-12-01 DIAGNOSIS — Y92410 Unspecified street and highway as the place of occurrence of the external cause: Secondary | ICD-10-CM | POA: Insufficient documentation

## 2020-12-01 DIAGNOSIS — Z79899 Other long term (current) drug therapy: Secondary | ICD-10-CM | POA: Diagnosis not present

## 2020-12-01 DIAGNOSIS — R1012 Left upper quadrant pain: Secondary | ICD-10-CM | POA: Diagnosis not present

## 2020-12-01 DIAGNOSIS — T1490XA Injury, unspecified, initial encounter: Secondary | ICD-10-CM

## 2020-12-01 DIAGNOSIS — Z91040 Latex allergy status: Secondary | ICD-10-CM | POA: Diagnosis not present

## 2020-12-01 MED ORDER — IOHEXOL 350 MG/ML SOLN
85.0000 mL | Freq: Once | INTRAVENOUS | Status: AC | PRN
  Administered 2020-12-01: 85 mL via INTRAVENOUS

## 2020-12-01 MED ORDER — KETOROLAC TROMETHAMINE 15 MG/ML IJ SOLN
15.0000 mg | Freq: Once | INTRAMUSCULAR | Status: AC
  Administered 2020-12-01: 15 mg via INTRAVENOUS
  Filled 2020-12-01: qty 1

## 2020-12-01 MED ORDER — CELECOXIB 200 MG PO CAPS: 200.0000 mg | ORAL_CAPSULE | Freq: Two times a day (BID) | ORAL | 0 refills | Status: AC

## 2020-12-01 NOTE — ED Provider Notes (Signed)
Ultrasound ED Peripheral IV (Provider)  Date/Time: 12/01/2020 6:43 PM Performed by: Rozelle Logan, DO Authorized by: Rozelle Logan, DO   Procedure details:    Indications: multiple failed IV attempts and poor IV access     Skin Prep: chlorhexidine gluconate     Location:  Left AC   Angiocath:  20 G   Bedside Ultrasound Guided: Yes     Images: not archived     Patient tolerated procedure without complications: Yes     Dressing applied: Yes      Rozelle Logan, DO 12/01/20 1843

## 2020-12-01 NOTE — Discharge Instructions (Addendum)
Your images were all negative. Return to the emergency department immediately if you develop any of the following symptoms: You have numbness, tingling, or weakness in the arms or legs. You develop severe headaches not relieved with medicine. You have severe neck pain, especially tenderness in the middle of the back of your neck. You have changes in bowel or bladder control. There is increasing pain in any area of the body. You have shortness of breath, light-headedness, dizziness, or fainting. You have chest pain. You feel sick to your stomach (nauseous), throw up (vomit), or sweat. You have increasing abdominal discomfort. There is blood in your urine, stool, or vomit. You have pain in your shoulder (shoulder strap areas). You feel your symptoms are getting worse.
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
## 2020-12-11 ENCOUNTER — Emergency Department (HOSPITAL_BASED_OUTPATIENT_CLINIC_OR_DEPARTMENT_OTHER)
Admission: EM | Admit: 2020-12-11 | Discharge: 2020-12-11 | Disposition: A | Discharge status: Home / Self Care | Payer: Medicare HMO | Attending: Emergency Medicine | Admitting: Emergency Medicine

## 2020-12-11 ENCOUNTER — Other Ambulatory Visit: Payer: Self-pay

## 2020-12-11 ENCOUNTER — Emergency Department (HOSPITAL_BASED_OUTPATIENT_CLINIC_OR_DEPARTMENT_OTHER): Payer: Medicare HMO

## 2020-12-11 ENCOUNTER — Encounter (HOSPITAL_BASED_OUTPATIENT_CLINIC_OR_DEPARTMENT_OTHER): Payer: Self-pay | Admitting: *Deleted

## 2020-12-11 DIAGNOSIS — R079 Chest pain, unspecified: Secondary | ICD-10-CM | POA: Insufficient documentation

## 2020-12-11 DIAGNOSIS — R110 Nausea: Secondary | ICD-10-CM | POA: Insufficient documentation

## 2020-12-11 DIAGNOSIS — Z5321 Procedure and treatment not carried out due to patient leaving prior to being seen by health care provider: Secondary | ICD-10-CM | POA: Insufficient documentation

## 2020-12-11 NOTE — ED Triage Notes (Signed)
C/o left sided chest pain with nausea x 1 hr ago

## 2021-10-02 ENCOUNTER — Emergency Department (HOSPITAL_BASED_OUTPATIENT_CLINIC_OR_DEPARTMENT_OTHER)
Admission: EM | Admit: 2021-10-02 | Discharge: 2021-10-02 | Disposition: A | Discharge status: Home / Self Care | Payer: Medicare HMO | Attending: Emergency Medicine | Admitting: Emergency Medicine

## 2021-10-02 ENCOUNTER — Other Ambulatory Visit: Payer: Self-pay

## 2021-10-02 ENCOUNTER — Encounter (HOSPITAL_BASED_OUTPATIENT_CLINIC_OR_DEPARTMENT_OTHER): Payer: Self-pay | Admitting: Emergency Medicine

## 2021-10-02 DIAGNOSIS — I10 Essential (primary) hypertension: Secondary | ICD-10-CM | POA: Insufficient documentation

## 2021-10-02 DIAGNOSIS — G9332 Myalgic encephalomyelitis/chronic fatigue syndrome: Secondary | ICD-10-CM | POA: Diagnosis not present

## 2021-10-02 DIAGNOSIS — Z79899 Other long term (current) drug therapy: Secondary | ICD-10-CM | POA: Diagnosis not present

## 2021-10-02 DIAGNOSIS — Z91040 Latex allergy status: Secondary | ICD-10-CM | POA: Diagnosis not present

## 2021-10-02 DIAGNOSIS — R1013 Epigastric pain: Secondary | ICD-10-CM | POA: Insufficient documentation

## 2021-10-02 DIAGNOSIS — M797 Fibromyalgia: Secondary | ICD-10-CM | POA: Insufficient documentation

## 2021-10-02 DIAGNOSIS — J45909 Unspecified asthma, uncomplicated: Secondary | ICD-10-CM | POA: Insufficient documentation

## 2021-10-02 DIAGNOSIS — Z20822 Contact with and (suspected) exposure to covid-19: Secondary | ICD-10-CM | POA: Diagnosis not present

## 2021-10-02 DIAGNOSIS — R748 Abnormal levels of other serum enzymes: Secondary | ICD-10-CM | POA: Insufficient documentation

## 2021-10-02 DIAGNOSIS — R109 Unspecified abdominal pain: Secondary | ICD-10-CM | POA: Diagnosis present

## 2021-10-02 LAB — CBC
HCT: 37.8 % (ref 36.0–46.0)
Hemoglobin: 13.1 g/dL (ref 12.0–15.0)
MCH: 30.4 pg (ref 26.0–34.0)
MCHC: 34.7 g/dL (ref 30.0–36.0)
MCV: 87.7 fL (ref 80.0–100.0)
Platelets: 309 10*3/uL (ref 150–400)
RBC: 4.31 MIL/uL (ref 3.87–5.11)
RDW: 12.1 % (ref 11.5–15.5)
WBC: 6.5 10*3/uL (ref 4.0–10.5)
nRBC: 0 % (ref 0.0–0.2)

## 2021-10-02 LAB — URINALYSIS, ROUTINE W REFLEX MICROSCOPIC
Bilirubin Urine: NEGATIVE
Glucose, UA: NEGATIVE mg/dL
Ketones, ur: NEGATIVE mg/dL
Leukocytes,Ua: NEGATIVE
Nitrite: NEGATIVE
Protein, ur: NEGATIVE mg/dL
Specific Gravity, Urine: 1.015 (ref 1.005–1.030)
pH: 7 (ref 5.0–8.0)

## 2021-10-02 LAB — RESP PANEL BY RT-PCR (FLU A&B, COVID) ARPGX2
Influenza A by PCR: NEGATIVE
Influenza B by PCR: NEGATIVE
SARS Coronavirus 2 by RT PCR: NEGATIVE

## 2021-10-02 LAB — COMPREHENSIVE METABOLIC PANEL
ALT: 26 U/L (ref 0–44)
AST: 25 U/L (ref 15–41)
Albumin: 4.2 g/dL (ref 3.5–5.0)
Alkaline Phosphatase: 54 U/L (ref 38–126)
Anion gap: 9 (ref 5–15)
BUN: 13 mg/dL (ref 6–20)
CO2: 26 mmol/L (ref 22–32)
Calcium: 9.3 mg/dL (ref 8.9–10.3)
Chloride: 99 mmol/L (ref 98–111)
Creatinine, Ser: 0.68 mg/dL (ref 0.44–1.00)
GFR, Estimated: >60 mL/min (ref >60)
Glucose, Bld: 93 mg/dL (ref 70–99)
Potassium: 3.7 mmol/L (ref 3.5–5.1)
Sodium: 134 mmol/L — ABNORMAL LOW (ref 135–145)
Total Bilirubin: 0.4 mg/dL (ref 0.3–1.2)
Total Protein: 7.4 g/dL (ref 6.5–8.1)

## 2021-10-02 LAB — URINALYSIS, MICROSCOPIC (REFLEX)

## 2021-10-02 LAB — LIPASE, BLOOD: Lipase: 69 U/L — ABNORMAL HIGH (ref 11–51)

## 2021-10-02 LAB — PREGNANCY, URINE: Preg Test, Ur: NEGATIVE

## 2021-10-02 MED ORDER — ALUM & MAG HYDROXIDE-SIMETH 200-200-20 MG/5ML PO SUSP
30.0000 mL | Freq: Once | ORAL | Status: AC
  Administered 2021-10-02: 30 mL via ORAL
  Filled 2021-10-02: qty 30

## 2021-10-02 MED ORDER — PROCHLORPERAZINE EDISYLATE 10 MG/2ML IJ SOLN
10.0000 mg | Freq: Once | INTRAMUSCULAR | Status: AC
  Administered 2021-10-02: 10 mg via INTRAVENOUS
  Filled 2021-10-02: qty 2

## 2021-10-02 MED ORDER — DEXAMETHASONE SODIUM PHOSPHATE 10 MG/ML IJ SOLN
10.0000 mg | Freq: Once | INTRAMUSCULAR | Status: AC
  Administered 2021-10-02: 10 mg via INTRAVENOUS
  Filled 2021-10-02: qty 1

## 2021-10-02 MED ORDER — SODIUM CHLORIDE 0.9 % IV BOLUS
500.0000 mL | Freq: Once | INTRAVENOUS | Status: AC
  Administered 2021-10-02: 500 mL via INTRAVENOUS

## 2021-10-02 MED ORDER — KETOROLAC TROMETHAMINE 15 MG/ML IJ SOLN
15.0000 mg | Freq: Once | INTRAMUSCULAR | Status: AC
  Administered 2021-10-02: 15 mg via INTRAVENOUS
  Filled 2021-10-02: qty 1

## 2021-10-02 MED ORDER — DIPHENHYDRAMINE HCL 50 MG/ML IJ SOLN
12.5000 mg | Freq: Once | INTRAMUSCULAR | Status: AC
  Administered 2021-10-02: 12.5 mg via INTRAVENOUS
  Filled 2021-10-02: qty 1

## 2021-10-02 NOTE — ED Notes (Signed)
Pt urinated before coming to room, unable to provide urine specimen at this time.

## 2021-10-02 NOTE — ED Notes (Signed)
Pt instructed to be NPO until further orders from ED MD

## 2021-10-02 NOTE — ED Provider Notes (Signed)
[facility] EMERGENCY DEPARTMENT Provider Note   CSN: [id number] Arrival date & time: 10/02/21  1541     History  Chief Complaint  Patient presents with   Abdominal Pain    Brooke Cruz is a 51 y.o. female with medical history significant for anxiety depression, arthritis, asthma, chronic back pain, chronic fatigue syndrome, fibromyalgia, GERD, hypertension, multiple food allergies.  Patient presents to ED for evaluation of abdominal pain, nausea and vomiting.  Patient reports that ever since being in the sun last Saturday she has felt ill.  Her symptoms include headache, body aches and chills.  Patient reports that for the last 2 days she has had abdominal pain located in the center of her abdomen with 1 episode of nausea and vomiting.  The patient denies any fevers, diarrhea, dysuria, flank pain, sore throat.  The patient denies any known sick contacts.  Patient also states that she is suffering from headache.  The patient states she has had this headache for "months".  Patient reports that she was recently seen by her neurologist yesterday and they have planned to have an outpatient MRI done.  Patient denies any unilateral weakness or numbness.   Abdominal Pain Associated symptoms: chills, fatigue, nausea and vomiting   Associated symptoms: no dysuria, no fever and no sore throat        Home Medications Prior to Admission medications   Medication Sig Start Date End Date Taking? Authorizing Provider  amLODIPine Besylate (NORVASC PO) Take 10 mg by mouth daily.    [provider]  busPIRone (BUSPAR) 10 MG tablet Take 10 mg by mouth 2 (two) times daily.    [provider]  carbamazepine (CARBATROL) 100 MG 12 hr capsule Take 200 mg by mouth 2 (two) times daily as needed.  03/29/19   [provider]  celecoxib (CELEBREX) 200 MG capsule Take 1 capsule (200 mg total) by mouth 2 (two) times daily. 12/01/20   Arthor Captain, PA-C  cetirizine  (ZYRTEC) 10 MG tablet Take by mouth daily.    [provider]  citalopram (CELEXA) 40 MG tablet daily. 07/29/15   [provider]  cyclobenzaprine (FLEXERIL) 10 MG tablet Take 1 tablet (10 mg total) by mouth 3 (three) times daily as needed. 04/14/16   Levert Feinstein, MD  DULoxetine (CYMBALTA) 60 MG capsule Take 1 capsule (60 mg total) by mouth daily. 07/30/16   Levert Feinstein, MD  EPINEPHrine (EPIPEN) 0.3 mg/0.3 mL DEVI Inject 0.3 mLs (0.3 mg total) into the muscle as needed. 04/04/12   Zadie Rhine, MD  gabapentin (NEURONTIN) 600 MG tablet Take 600 mg by mouth daily.  02/22/19   [provider]  gabapentin (NEURONTIN) 800 MG tablet Take 800 mg by mouth at bedtime. 03/22/19   [provider]  hydrochlorothiazide (HYDRODIURIL) 25 MG tablet Take 25 mg by mouth 2 (two) times daily.    [provider]  HYDROcodone-acetaminophen (NORCO/VICODIN) 5-325 MG tablet Take 1 tablet by mouth every 6 (six) hours as needed. 04/17/17   Charlynne Pander, MD  icosapent Ethyl (VASCEPA) 1 g capsule Take 2 g by mouth 2 (two) times daily.    [provider]  LORazepam (ATIVAN) 1 MG tablet Take 1 mg by mouth 3 (three) times daily.    [provider]  ondansetron (ZOFRAN) 4 MG tablet Take 1 tablet (4 mg total) by mouth every 8 (eight) hours as needed for nausea or vomiting. 05/08/19   Young, Johanna C, PA-C  potassium chloride SA (  KLOR-CON) 20 MEQ tablet Take 1 tablet (20 mEq total) by mouth daily. 01/07/19   Jacalyn Lefevre, MD  pravastatin (PRAVACHOL) 40 MG tablet Take 40 mg by mouth daily.    [provider]  predniSONE (DELTASONE) 20 MG tablet Take 60 mg daily x 2 days then 40 mg daily x 2 days then 20 mg daily x 2 days 04/17/17   Charlynne Pander, MD  TIZANIDINE HCL PO Take by mouth.    [provider]  triamcinolone cream (KENALOG) 0.1 % Apply 1 application topically 2 (two) times daily. Avoid incisions. 07/03/19   Young, Johanna C, PA-C  VITAMIN  D PO Take by mouth once a week.    [provider]      Allergies    Influenza vaccines, Eggs or egg-derived products, Latex, Onion, Other, Sulfa antibiotics, and Corn oil    Review of Systems   Review of Systems  Constitutional:  Positive for chills and fatigue. Negative for fever.  HENT:  Negative for sore throat.   Gastrointestinal:  Positive for abdominal pain, nausea and vomiting.  Genitourinary:  Negative for dysuria and flank pain.  Neurological:  Positive for headaches.    Physical Exam Updated Vital Signs BP 120/70   Pulse 80   Temp 98.9 F (37.2 C)   Resp 18   Ht 5\' 2"  (1.575 m)   Wt 83.9 kg   SpO2 95%   BMI 33.84 kg/m  Physical Exam Vitals and nursing note reviewed.  Constitutional:      General: She is not in acute distress.    Appearance: Normal appearance. She is not ill-appearing, toxic-appearing or diaphoretic.  HENT:     Head: Normocephalic and atraumatic.     Nose: Nose normal. No congestion.     Mouth/Throat:     Mouth: Mucous membranes are moist.     Pharynx: Oropharynx is clear.  Eyes:     Extraocular Movements: Extraocular movements intact.     Conjunctiva/sclera: Conjunctivae normal.     Pupils: Pupils are equal, round, and reactive to light.  Cardiovascular:     Rate and Rhythm: Normal rate and regular rhythm.  Pulmonary:     Effort: Pulmonary effort is normal.     Breath sounds: Normal breath sounds. No stridor. No wheezing, rhonchi or rales.  Abdominal:     General: Abdomen is flat. Bowel sounds are normal.     Palpations: Abdomen is soft.     Tenderness: There is abdominal tenderness in the epigastric area. There is no right CVA tenderness or left CVA tenderness.  Musculoskeletal:     Cervical back: Normal range of motion and neck supple. No rigidity or tenderness.  Lymphadenopathy:     Cervical: No cervical adenopathy.  Skin:    General: Skin is warm and dry.     Capillary Refill: Capillary refill takes less than 2 seconds.   Neurological:     General: No focal deficit present.     Mental Status: She is alert and oriented to person, place, and time.     GCS: GCS eye subscore is 4. GCS verbal subscore is 5. GCS motor subscore is 6.     Cranial Nerves: Cranial nerves 2-12 are intact. No cranial nerve deficit.     Sensory: Sensation is intact. No sensory deficit.     Motor: Motor function is intact. No weakness.     Coordination: Coordination is intact. Heel to [facility] Test normal.     ED Results / Procedures /  Treatments   Labs (all labs ordered are listed, but only abnormal results are displayed) Labs Reviewed  LIPASE, BLOOD - Abnormal; Notable for the following components:      Result Value   Lipase 69 (*)    All other components within normal limits  COMPREHENSIVE METABOLIC PANEL - Abnormal; Notable for the following components:   Sodium 134 (*)    All other components within normal limits  URINALYSIS, ROUTINE W REFLEX MICROSCOPIC - Abnormal; Notable for the following components:   Hgb urine dipstick SMALL (*)    All other components within normal limits  URINALYSIS, MICROSCOPIC (REFLEX) - Abnormal; Notable for the following components:   Bacteria, UA FEW (*)    All other components within normal limits  RESP PANEL BY RT-PCR (FLU A&B, COVID) ARPGX2  CBC  PREGNANCY, URINE    EKG None  Radiology No results found.  Procedures Procedures   Medications Ordered in ED Medications  alum & mag hydroxide-simeth (MAALOX/MYLANTA) 200-200-20 MG/5ML suspension 30 mL (30 mLs Oral Given 10/02/21 1954)  prochlorperazine (COMPAZINE) injection 10 mg (10 mg Intravenous Given 10/02/21 1953)  diphenhydrAMINE (BENADRYL) injection 12.5 mg (12.5 mg Intravenous Given 10/02/21 1955)  sodium chloride 0.9 % bolus 500 mL ( Intravenous Stopped 10/02/21 2127)  dexamethasone (DECADRON) injection 10 mg (10 mg Intravenous Given 10/02/21 2055)  ketorolac (TORADOL) 15 MG/ML injection 15 mg (15 mg Intravenous Given 10/02/21 2055)     ED Course/ Medical Decision Making/ A&P                           Medical Decision Making Amount and/or Complexity of Data Reviewed Labs: ordered.  Risk OTC drugs. Prescription drug management.   51 year old female presents to ED for evaluation.  Please see HPI for further details  On examination, patient afebrile and nontachycardic.  Patient lung sounds clear bilaterally, not hypoxic on room air.  The patient abdomen is soft and compressible all 4 quadrants of the patient does endorse epigastric tenderness.  Patient neurological examination shows no focal neurodeficits.  The patient is nontoxic in appearance.  Patient worked up utilizing the following labs imaging studies interpreted by me personally: - Lipase elevated at 69 - CBC unremarkable - CMP shows slightly decreased sodium 134 - Urinalysis shows small hemoglobin urine - Respiratory panel is negative for COVID, flu  Patient initially treated with 1 L normal saline, GI cocktail, 10 mg Compazine, 12 and half milligrams Benadryl.  The patient states that Compazine and Benadryl made her headache worse.  Patient then treated with 10 mg Decadron, 50 mg Toradol.  Patient reports that these did slightly alleviate her headache.  Patient requesting discharge at this time, states that she just wants to go home and "be comfortable".  Patient return precautions provided and she voiced understanding.  The patient had all of her questions answered to her satisfaction.  The patient is stable this time for discharge home   Final Clinical Impression(s) / ED Diagnoses Final diagnoses:  Abdominal pain, unspecified abdominal location  Chronic fatigue syndrome with fibromyalgia    Rx / DC Orders ED Discharge Orders     None         Clent Ridges 10/02/21 2204    Terald Sleeper, MD 10/02/21 [phone]

## 2021-10-02 NOTE — Discharge Instructions (Signed)
Please return to the ED with any new or worsening symptoms Please follow-up with your PCP for further management Please read attached informational guide

## 2021-10-02 NOTE — ED Notes (Signed)
Pt refused blood draw in case of needing IV once in a room did not want to be stuck twice. Spoke to [employer] and instructed pt to remain NPO per order and would get IV once in room. Pt agreed and understood.

## 2021-10-02 NOTE — ED Triage Notes (Signed)
Patient presents to ED via POV from home. Patient reports 2 day history of abdominal pain and nausea. Reports 1 episode of vomiting today. Denies diarrhea.

## 2021-10-18 ENCOUNTER — Emergency Department (HOSPITAL_BASED_OUTPATIENT_CLINIC_OR_DEPARTMENT_OTHER)
Admission: EM | Admit: 2021-10-18 | Discharge: 2021-10-18 | Disposition: A | Discharge status: Home / Self Care | Payer: Medicare HMO | Attending: Emergency Medicine | Admitting: Emergency Medicine

## 2021-10-18 ENCOUNTER — Encounter (HOSPITAL_BASED_OUTPATIENT_CLINIC_OR_DEPARTMENT_OTHER): Payer: Self-pay | Admitting: Emergency Medicine

## 2021-10-18 ENCOUNTER — Other Ambulatory Visit: Payer: Self-pay

## 2021-10-18 DIAGNOSIS — Z91010 Allergy to peanuts: Secondary | ICD-10-CM | POA: Insufficient documentation

## 2021-10-18 DIAGNOSIS — K0889 Other specified disorders of teeth and supporting structures: Secondary | ICD-10-CM | POA: Diagnosis present

## 2021-10-18 MED ORDER — PENICILLIN V POTASSIUM 500 MG PO TABS: 500.0000 mg | ORAL_TABLET | Freq: Three times a day (TID) | ORAL | 0 refills | Status: AC

## 2021-10-18 MED ORDER — KETOROLAC TROMETHAMINE 60 MG/2ML IM SOLN
60.0000 mg | Freq: Once | INTRAMUSCULAR | Status: AC
  Administered 2021-10-18: 60 mg via INTRAMUSCULAR
  Filled 2021-10-18: qty 2

## 2021-10-18 MED ORDER — PENICILLIN V POTASSIUM 250 MG PO TABS
500.0000 mg | ORAL_TABLET | Freq: Once | ORAL | Status: AC
  Administered 2021-10-18: 500 mg via ORAL
  Filled 2021-10-18: qty 2

## 2021-10-18 NOTE — ED Provider Notes (Signed)
[facility] EMERGENCY DEPARTMENT Provider Note   CSN: [id number] Arrival date & time: 10/18/21  0012     History  Chief Complaint  Patient presents with   Dental Pain    Brooke Cruz is a 51 y.o. female.  The history is provided by the patient.  Dental Pain Location:  Lower Quality:  Aching Severity:  Moderate Onset quality:  Gradual Timing:  Constant Progression:  Worsening Chronicity:  New Relieved by:  Nothing Patient reports pain in her lower teeth.  No fevers or vomiting.  Has follow-up with dentist next week     Home Medications Prior to Admission medications   Medication Sig Start Date End Date Taking? Authorizing Provider  penicillin v potassium (VEETID) 500 MG tablet Take 1 tablet (500 mg total) by mouth 3 (three) times daily for 7 days. 10/18/21 10/25/21 Yes Zadie Rhine, MD  amLODIPine Besylate (NORVASC PO) Take 10 mg by mouth daily.    [provider]  busPIRone (BUSPAR) 10 MG tablet Take 10 mg by mouth 2 (two) times daily.    [provider]  carbamazepine (CARBATROL) 100 MG 12 hr capsule Take 200 mg by mouth 2 (two) times daily as needed.  03/29/19   [provider]  celecoxib (CELEBREX) 200 MG capsule Take 1 capsule (200 mg total) by mouth 2 (two) times daily. 12/01/20   Arthor Captain, PA-C  cetirizine (ZYRTEC) 10 MG tablet Take by mouth daily.    [provider]  citalopram (CELEXA) 40 MG tablet daily. 07/29/15   [provider]  cyclobenzaprine (FLEXERIL) 10 MG tablet Take 1 tablet (10 mg total) by mouth 3 (three) times daily as needed. 04/14/16   Levert Feinstein, MD  DULoxetine (CYMBALTA) 60 MG capsule Take 1 capsule (60 mg total) by mouth daily. 07/30/16   Levert Feinstein, MD  EPINEPHrine (EPIPEN) 0.3 mg/0.3 mL DEVI Inject 0.3 mLs (0.3 mg total) into the muscle as needed. 04/04/12   Zadie Rhine, MD  gabapentin (NEURONTIN) 600 MG tablet Take 600 mg by mouth daily.  02/22/19   [provider]  gabapentin (NEURONTIN) 800 MG tablet Take 800 mg by mouth at bedtime. 03/22/19   [provider]  hydrochlorothiazide (HYDRODIURIL) 25 MG tablet Take 25 mg by mouth 2 (two) times daily.    [provider]  HYDROcodone-acetaminophen (NORCO/VICODIN) 5-325 MG tablet Take 1 tablet by mouth every 6 (six) hours as needed. 04/17/17   Charlynne Pander, MD  icosapent Ethyl (VASCEPA) 1 g capsule Take 2 g by mouth 2 (two) times daily.    [provider]  LORazepam (ATIVAN) 1 MG tablet Take 1 mg by mouth 3 (three) times daily.    [provider]  ondansetron (ZOFRAN) 4 MG tablet Take 1 tablet (4 mg total) by mouth every 8 (eight) hours as needed for nausea or vomiting. 05/08/19   Young, Johanna C, PA-C  potassium chloride SA (KLOR-CON) 20 MEQ tablet Take 1 tablet (20 mEq total) by mouth daily. 01/07/19   Jacalyn Lefevre, MD  pravastatin (PRAVACHOL) 40 MG tablet Take 40 mg by mouth daily.    [provider]  predniSONE (DELTASONE) 20 MG tablet Take 60 mg daily x 2 days then 40 mg daily x 2 days then 20 mg daily x 2 days 04/17/17   Charlynne Pander, MD  TIZANIDINE HCL PO Take by mouth.    [provider]  triamcinolone cream (KENALOG) 0.1 % Apply 1 application topically 2 (two) times daily. Avoid  incisions. 07/03/19   Young, Johanna C, PA-C  VITAMIN D PO Take by mouth once a week.    [provider]      Allergies    Influenza vaccines, Eggs or egg-derived products, Latex, Onion, Other, Sulfa antibiotics, and Corn oil    Review of Systems   Review of Systems  Physical Exam Updated Vital Signs BP (!) 142/84 (BP Location: Right Arm)   Pulse 75   Temp 98.4 F (36.9 C) (Oral)   Resp 17   Ht 1.575 m (5\' 2" )   Wt 79.4 kg   SpO2 94%   BMI 32.01 kg/m  Physical Exam CONSTITUTIONAL: Well developed/well nourished HEAD AND FACE: Normocephalic/atraumatic EYES: EOMI/PERRL ENMT: Mucous membranes moist.  Poor dentition.  No trismus.  No focal  abscess noted. NECK: supple no meningeal signs NEURO: Pt is awake/alert, moves all extremitiesx4 EXTREMITIES:full ROM SKIN: warm, color normal  ED Results / Procedures / Treatments   Labs (all labs ordered are listed, but only abnormal results are displayed) Labs Reviewed - No data to display  EKG None  Radiology No results found.  Procedures Procedures    Medications Ordered in ED Medications  ketorolac (TORADOL) injection 60 mg (has no administration in time range)  penicillin v potassium (VEETID) tablet 500 mg (has no administration in time range)    ED Course/ Medical Decision Making/ A&P                           Medical Decision Making Risk Prescription drug management.   Patient requesting Toradol.  Will start antibiotics        Final Clinical Impression(s) / ED Diagnoses Final diagnoses:  Pain, dental    Rx / DC Orders ED Discharge Orders          Ordered    penicillin v potassium (VEETID) 500 MG tablet  3 times daily        10/18/21 0031              10/20/21, MD 10/18/21 [phone]

## 2021-10-18 NOTE — ED Triage Notes (Signed)
Front lower dental pain since Monday. Pt states she had dental trauma at 40 years ago and that she was told "this could happen".

## 2021-11-11 ENCOUNTER — Encounter (INDEPENDENT_AMBULATORY_CARE_PROVIDER_SITE_OTHER): Payer: Self-pay

## 2022-03-29 ENCOUNTER — Emergency Department (HOSPITAL_BASED_OUTPATIENT_CLINIC_OR_DEPARTMENT_OTHER)
Admission: EM | Admit: 2022-03-29 | Discharge: 2022-03-30 | Disposition: A | Discharge status: Home / Self Care | Payer: Medicare HMO | Attending: Emergency Medicine | Admitting: Emergency Medicine

## 2022-03-29 ENCOUNTER — Other Ambulatory Visit: Payer: Self-pay

## 2022-03-29 ENCOUNTER — Encounter (HOSPITAL_BASED_OUTPATIENT_CLINIC_OR_DEPARTMENT_OTHER): Payer: Self-pay

## 2022-03-29 DIAGNOSIS — G43909 Migraine, unspecified, not intractable, without status migrainosus: Secondary | ICD-10-CM | POA: Diagnosis present

## 2022-03-29 DIAGNOSIS — Z20822 Contact with and (suspected) exposure to covid-19: Secondary | ICD-10-CM | POA: Insufficient documentation

## 2022-03-29 DIAGNOSIS — Z91040 Latex allergy status: Secondary | ICD-10-CM | POA: Insufficient documentation

## 2022-03-29 DIAGNOSIS — G43009 Migraine without aura, not intractable, without status migrainosus: Secondary | ICD-10-CM

## 2022-03-29 LAB — RESP PANEL BY RT-PCR (RSV, FLU A&B, COVID)  RVPGX2
Influenza A by PCR: NEGATIVE
Influenza B by PCR: NEGATIVE
Resp Syncytial Virus by PCR: NEGATIVE
SARS Coronavirus 2 by RT PCR: NEGATIVE

## 2022-03-29 MED ORDER — DEXAMETHASONE SODIUM PHOSPHATE 4 MG/ML IJ SOLN
4.0000 mg | Freq: Once | INTRAMUSCULAR | Status: AC
  Administered 2022-03-30: 4 mg via INTRAVENOUS
  Filled 2022-03-29: qty 1

## 2022-03-29 MED ORDER — KETOROLAC TROMETHAMINE 15 MG/ML IJ SOLN
15.0000 mg | Freq: Once | INTRAMUSCULAR | Status: AC
  Administered 2022-03-30: 15 mg via INTRAVENOUS
  Filled 2022-03-29: qty 1

## 2022-03-29 MED ORDER — SODIUM CHLORIDE 0.9 % IV BOLUS
1000.0000 mL | Freq: Once | INTRAVENOUS | Status: AC
  Administered 2022-03-30: 1000 mL via INTRAVENOUS

## 2022-03-29 MED ORDER — ONDANSETRON HCL 4 MG/2ML IJ SOLN
4.0000 mg | Freq: Once | INTRAMUSCULAR | Status: AC
  Administered 2022-03-30: 4 mg via INTRAVENOUS
  Filled 2022-03-29: qty 2

## 2022-03-29 NOTE — ED Triage Notes (Signed)
Patient arrives POV c/o nausea, vomiting, headache, and dizziness. Pt states she vomited 6 times last time starting at midnight. Pt states she took zofran 4 mg at 2pm today with no relief. Patient has hx of migraines; denies exposure to anyone sick.

## 2022-03-29 NOTE — ED Notes (Signed)
Pt. Reports she has had a headache since Thurs. With no relief.   Pt. Has nausea with the headache.  Pt. Said the meds ordered by neuro for the headaches are too expensive and she can't get.  Pt. To be treated here by [facility].

## 2022-03-30 LAB — URINALYSIS, ROUTINE W REFLEX MICROSCOPIC
Bilirubin Urine: NEGATIVE
Glucose, UA: NEGATIVE mg/dL
Ketones, ur: NEGATIVE mg/dL
Leukocytes,Ua: NEGATIVE
Nitrite: NEGATIVE
Protein, ur: NEGATIVE mg/dL
Specific Gravity, Urine: 1.02 (ref 1.005–1.030)
pH: 7 (ref 5.0–8.0)

## 2022-03-30 LAB — URINALYSIS, MICROSCOPIC (REFLEX)

## 2022-03-30 NOTE — ED Provider Notes (Signed)
[facility] EMERGENCY DEPARTMENT Provider Note   CSN: [id number] Arrival date & time: 03/29/22  2101     History  Chief Complaint  Patient presents with   n/v & headache    Brooke Cruz is a 51 y.o. female who presents to ED for acute migraine headache. Pt states headache and photophobia started 2 days ago with onset of nausea and vomiting last night. She reports she had a similar headache last week that was treated in office by her neurologist successfully. She has taken Zofran at home but no pain medications. Reports headache is from back of her neck up back of head and bilateral frontal. States these headache characteristics are typical for her. Denies focal weakness, acute paresthesias, syncope, head injury, diarrhea, fever, chills, or other complaints.       Home Medications Prior to Admission medications   Medication Sig Start Date End Date Taking? Authorizing Provider  amLODIPine Besylate (NORVASC PO) Take 10 mg by mouth daily.    [provider]  busPIRone (BUSPAR) 10 MG tablet Take 10 mg by mouth 2 (two) times daily.    [provider]  carbamazepine (CARBATROL) 100 MG 12 hr capsule Take 200 mg by mouth 2 (two) times daily as needed.  03/29/19   [provider]  celecoxib (CELEBREX) 200 MG capsule Take 1 capsule (200 mg total) by mouth 2 (two) times daily. 12/01/20   Arthor Captain, PA-C  cetirizine (ZYRTEC) 10 MG tablet Take by mouth daily.    [provider]  citalopram (CELEXA) 40 MG tablet daily. 07/29/15   [provider]  cyclobenzaprine (FLEXERIL) 10 MG tablet Take 1 tablet (10 mg total) by mouth 3 (three) times daily as needed. 04/14/16   Levert Feinstein, MD  DULoxetine (CYMBALTA) 60 MG capsule Take 1 capsule (60 mg total) by mouth daily. 07/30/16   Levert Feinstein, MD  EPINEPHrine (EPIPEN) 0.3 mg/0.3 mL DEVI Inject 0.3 mLs (0.3 mg total) into the muscle as needed. 04/04/12   Zadie Rhine, MD  gabapentin  (NEURONTIN) 600 MG tablet Take 600 mg by mouth daily.  02/22/19   [provider]  gabapentin (NEURONTIN) 800 MG tablet Take 800 mg by mouth at bedtime. 03/22/19   [provider]  hydrochlorothiazide (HYDRODIURIL) 25 MG tablet Take 25 mg by mouth 2 (two) times daily.    [provider]  HYDROcodone-acetaminophen (NORCO/VICODIN) 5-325 MG tablet Take 1 tablet by mouth every 6 (six) hours as needed. 04/17/17   Charlynne Pander, MD  icosapent Ethyl (VASCEPA) 1 g capsule Take 2 g by mouth 2 (two) times daily.    [provider]  LORazepam (ATIVAN) 1 MG tablet Take 1 mg by mouth 3 (three) times daily.    [provider]  ondansetron (ZOFRAN) 4 MG tablet Take 1 tablet (4 mg total) by mouth every 8 (eight) hours as needed for nausea or vomiting. 05/08/19   Young, Johanna C, PA-C  potassium chloride SA (KLOR-CON) 20 MEQ tablet Take 1 tablet (20 mEq total) by mouth daily. 01/07/19   Jacalyn Lefevre, MD  pravastatin (PRAVACHOL) 40 MG tablet Take 40 mg by mouth daily.    [provider]  TIZANIDINE HCL PO Take by mouth.    [provider]  triamcinolone cream (KENALOG) 0.1 % Apply 1 application topically 2 (two) times daily. Avoid incisions. 07/03/19   Young, Johanna C, PA-C  VITAMIN D PO Take by mouth once a week.    [provider]  Allergies    Influenza vaccines, Sumatriptan, Eggs or egg-derived products, Latex, Onion, Other, Sulfa antibiotics, and Corn oil    Review of Systems   Review of Systems  Constitutional:  Negative for activity change, appetite change, chills and fever.  HENT:  Negative for congestion, ear pain, rhinorrhea, sore throat and trouble swallowing.   Eyes:  Positive for photophobia. Negative for visual disturbance.  Respiratory:  Negative for cough, chest tightness and shortness of breath.   Cardiovascular:  Negative for chest pain and palpitations.  Gastrointestinal:  Positive for nausea and vomiting.  Negative for abdominal pain and diarrhea.  Genitourinary:  Negative for difficulty urinating, dysuria and hematuria.  Musculoskeletal:  Positive for neck pain. Negative for arthralgias and back pain.  Skin:  Negative for color change and rash.  Neurological:  Positive for headaches. Negative for seizures, syncope, facial asymmetry, speech difficulty, weakness, light-headedness and numbness.  Psychiatric/Behavioral:  Negative for confusion.   All other systems reviewed and are negative.   Physical Exam Updated Vital Signs BP (!) 123/58 (BP Location: Right Arm)   Pulse 89   Temp 98.2 F (36.8 C) (Oral)   Resp 16   Ht 5\' 2"  (1.575 m)   Wt 85.3 kg   SpO2 94%   BMI 34.39 kg/m  Physical Exam Vitals and nursing note reviewed.  Constitutional:      General: She is not in acute distress.    Appearance: Normal appearance. She is not toxic-appearing.     Comments: Sitting in dark room with eyes covered  HENT:     Head: Normocephalic and atraumatic.     Nose: No congestion.     Mouth/Throat:     Mouth: Mucous membranes are moist.  Eyes:     General: No scleral icterus.    Conjunctiva/sclera: Conjunctivae normal.  Cardiovascular:     Rate and Rhythm: Normal rate and regular rhythm.     Heart sounds: Normal heart sounds. No murmur heard. Pulmonary:     Effort: Pulmonary effort is normal. No respiratory distress.     Breath sounds: Normal breath sounds. No wheezing, rhonchi or rales.  Abdominal:     General: Abdomen is flat. There is no distension.     Palpations: Abdomen is soft.     Tenderness: There is no abdominal tenderness. There is no guarding or rebound.  Musculoskeletal:        General: Normal range of motion.     Cervical back: Normal range of motion and neck supple. No rigidity.     Right lower leg: No edema.     Left lower leg: No edema.  Skin:    General: Skin is warm and dry.     Capillary Refill: Capillary refill takes less than 2 seconds.  Neurological:      General: No focal deficit present.     Mental Status: She is alert and oriented to person, place, and time.     Cranial Nerves: No cranial nerve deficit, dysarthria or facial asymmetry.     Sensory: No sensory deficit.     Motor: No weakness.     Coordination: Coordination is intact.  Psychiatric:        Mood and Affect: Mood normal.        Behavior: Behavior normal.        Thought Content: Thought content normal.     ED Results / Procedures / Treatments   Labs (all labs ordered are listed, but only abnormal results are displayed) Labs Reviewed  URINALYSIS, ROUTINE W REFLEX MICROSCOPIC - Abnormal; Notable for the following components:      Result Value   Hgb urine dipstick SMALL (*)    All other components within normal limits  URINALYSIS, MICROSCOPIC (REFLEX) - Abnormal; Notable for the following components:   Bacteria, UA FEW (*)    All other components within normal limits  RESP PANEL BY RT-PCR (RSV, FLU A&B, COVID)  RVPGX2    EKG None  Radiology No results found.  Procedures None  Medications Ordered in ED Medications  ketorolac (TORADOL) 15 MG/ML injection 15 mg (15 mg Intravenous Given 03/30/22 0019)  dexamethasone (DECADRON) injection 4 mg (4 mg Intravenous Given 03/30/22 0020)  ondansetron (ZOFRAN) injection 4 mg (4 mg Intravenous Given 03/30/22 0017)  sodium chloride 0.9 % bolus 1,000 mL (0 mLs Intravenous Stopped 03/30/22 0206)    ED Course/ Medical Decision Making/ A&P                           Medical Decision Making Amount and/or Complexity of Data Reviewed Labs: ordered.  Risk Prescription drug management.   51 year old female presenting to ED with acute migraine headache. Characteristics typical for her migraine headaches. Reports she has had good relief with migraine cocktails in the past. Denies being on any daily preventatives but does have a neurologist she follows up with for her migraines. Took Zofran at home but no pain medication today.  Vital signs stable, unremarkable physical exam with pt neurologically intact and non-focal. No head trauma. No indication for imaging with typical migraine features and normal exam so will proceed with migraine cocktail to include IV hydration, Zofran, Toradol, and Decadron. Will not give Reglan as pt has history of akathisia with his (as well as Benadryl). Pt stable to be discharged home following treatment and headache relief.   Requesting UA. Has no dysuria, abdominal pain, hematuria, urgency, hesitancy, or other urinary symptoms. Does have history of UTIs. Will order this though do not believe it is related to her chief complaint today. Care signed out to night physician, Dr. Preston Fleeting, pending headache control, UA results, and discharge.          Final Clinical Impression(s) / ED Diagnoses Final diagnoses:  Migraine without aura and without status migrainosus, not intractable    Rx / DC Orders ED Discharge Orders     None         Tonette Lederer, PA-C 03/30/22 1503    Virgina Norfolk, DO 03/30/22 2330

## 2022-03-30 NOTE — Discharge Instructions (Addendum)
Thank you for letting us take care of you today.  We treated you with IV medications to relieve your migraine headache. This may not completely resolve your headache but should help relieve some of your symptoms.  Your physical exam and vital signs were reassuring so there was no indication for labs or imaging at this time.  Please follow-up with your primary care provider or neurologist in the next week to discuss long-term management of your recurrent migraines.   If you develop weakness on one side of your body, loss of consciousness, numbness or tingling that is abnormal for you, drooping of your face, injury to your head, or other concerns, please be re-evaluated at the closest emergency department.

## 2022-03-30 NOTE — ED Provider Notes (Addendum)
Care assumed from [facility], patient with migraine headache, just received migraine cocktail. Also pending urinalysis.  Urinalysis shows no evidence of infection.  Patient feels much better following migraine cocktail.  She is safe for discharge.   [name], MD 03/30/22 Lazarus Gowda    [name], MD 03/30/22 [phone]

## 2022-09-04 IMAGING — CT CT ANGIO HEAD
1 of 12 series · 2 of 35 positions shown · IV contrast (omnipaque)
Comparison: None.

CLINICAL DATA: Headache, neck pain.

EXAM:
CT HEAD WITHOUT CONTRAST
CT ANGIOGRAPHY OF THE HEAD AND NECK
TECHNIQUE: Contiguous axial images were obtained from the base of the skull
through the vertex without intravenous contrast. Multidetector CT
imaging of the head and neck was performed using the standard
protocol during bolus administration of intravenous contrast.
Multiplanar CT image reconstructions and MIPs were obtained to
evaluate the vascular anatomy. Carotid stenosis measurements (when
applicable) are obtained utilizing NASCET criteria, using the distal
internal carotid diameter as the denominator.
CONTRAST:  100mL OMNIPAQUE IOHEXOL 350 MG/ML SOLN

[Series 513: axial thin · axial · 0.41mm/px · z∈[+882,+993]mm · 2 of 334 slices shown]
[im 112/334  soft-tissue]
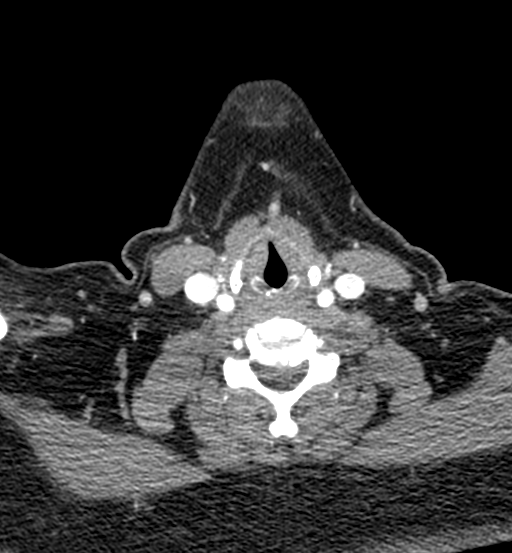
[im 223/334  bone]
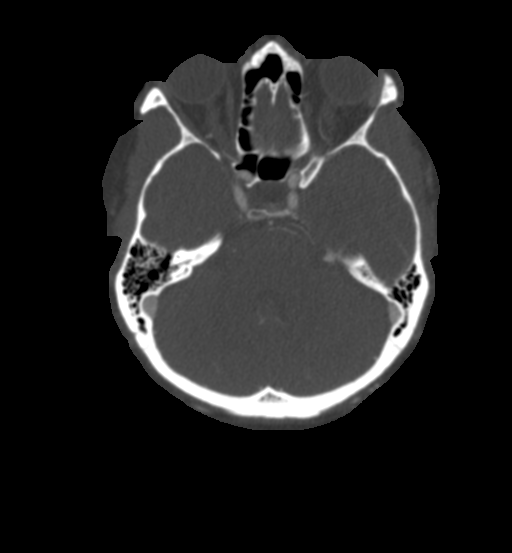

[2 of 35 positions shown; findings below may reference images not displayed]

FINDINGS: CT HEAD

Brain: No evidence of acute infarction, hemorrhage, hydrocephalus,
extra-axial collection or mass lesion/mass effect.

Skull: No acute fracture.

Sinuses: Clear sinuses.

Orbits: Unremarkable.

CTA NECK

Aortic arch: Great vessel origins are patent.

Right carotid system: No evidence of significant stenosis,
occlusion, or acute dissection.

Left carotid system: No evidence of significant stenosis, occlusion,
or acute dissection.

Vertebral arteries:Right dominant. Tortuous bilaterally. No evidence
of significant stenosis, occlusion, or acute dissection. The non
dominant left vertebral artery is diminutive throughout its course

Skeleton: Vertebral body heights are maintained. Facet arthropathy
is greatest on the left at C2-C3

Other neck: No mass or suspicious adenopathy.

Upper chest: Visualized lung apices are clear.

CTA HEAD

Limited evaluation secondary to venous contamination.

Anterior circulation:  No evidence of large vessel occlusion

Posterior circulation: Diminutive vertebrobasilar system with
bilateral fetal type PCAs, anatomic variant. No evidence of large
vessel occlusion. The proximal PCAs are patent without significant
stenosis. Poor visualization of the distal PCA secondary to venous.
Contamination.

Venous sinuses: No evidence of dural sinus thrombosis.
IMPRESSION: 1. No evidence of acute intracranial abnormality.
2. No evidence of acute arterial abnormality.
3. No evidence of significant stenosis in the neck.
4. Intracranially, evaluation is limited by venous contamination
without evidence of large vessel occlusion or proximal
hemodynamically significant stenosis.

## 2022-09-04 IMAGING — CR DG CHEST 2V
2 series · 2 of 2 positions shown · non-contrast
Comparison: January 07, 2019

CLINICAL DATA: Left shoulder and chest pain.

EXAM:
CHEST - 2 VIEW

[w chest pa]
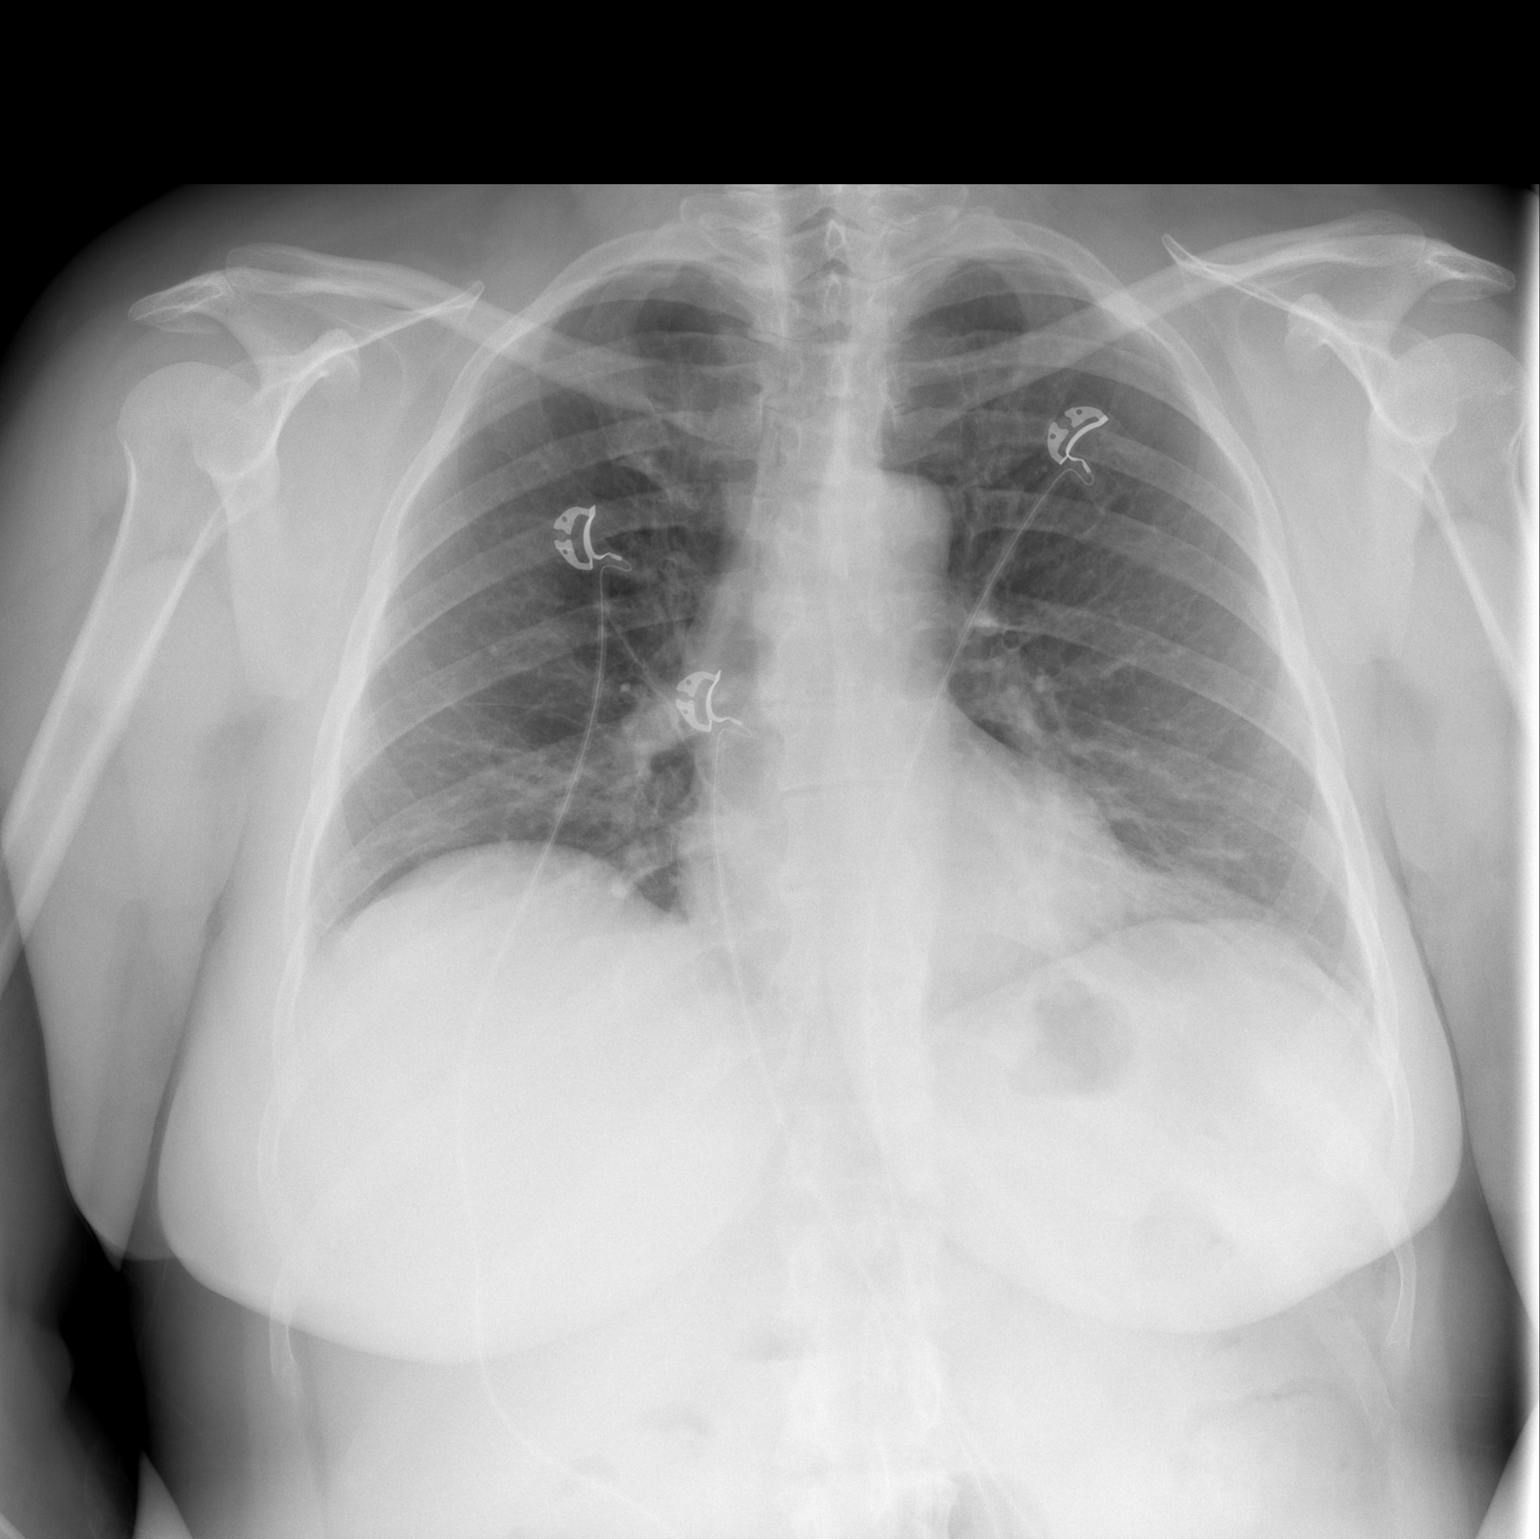

[w chest lat]
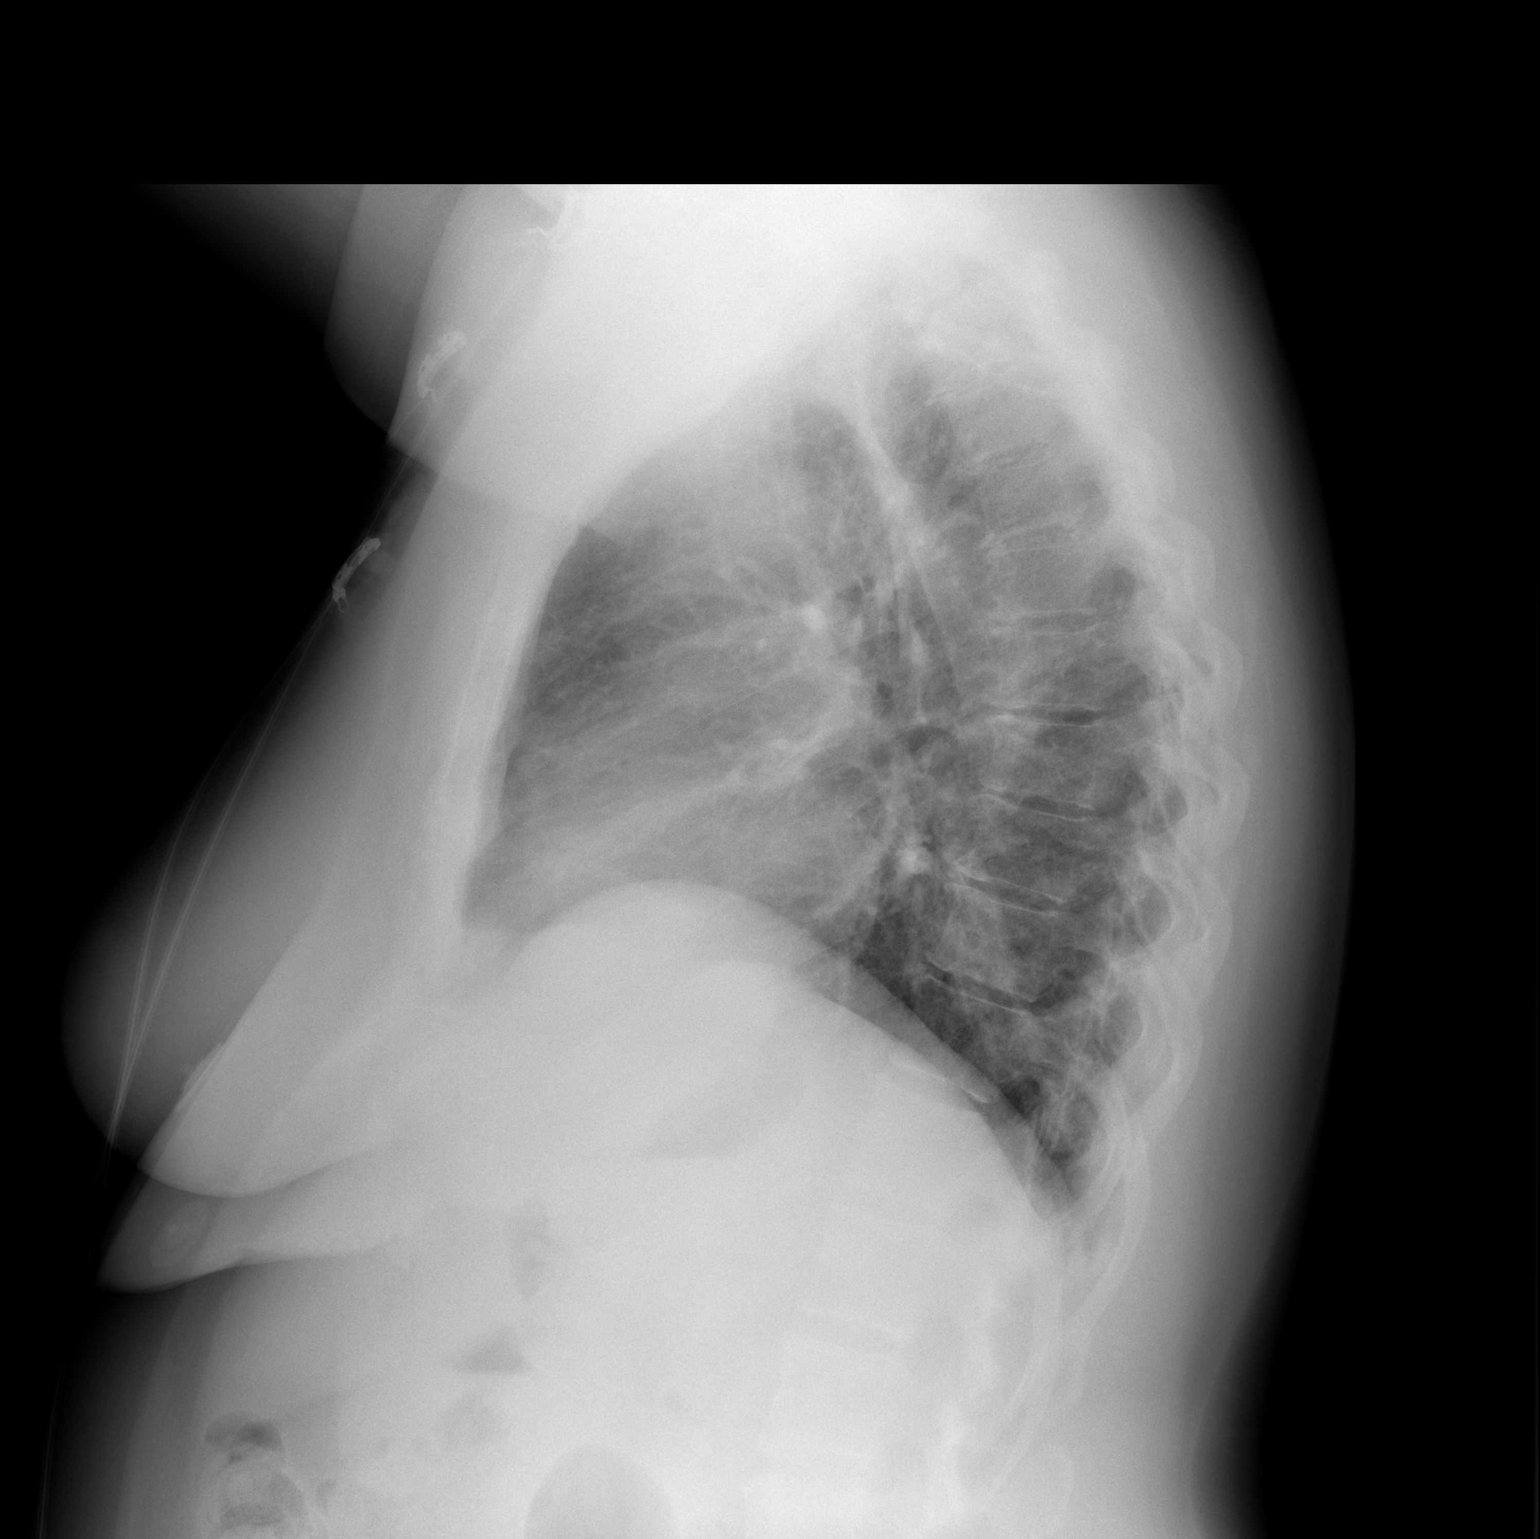

[2 of 2 positions shown; findings below may reference images not displayed]

FINDINGS: Cardiomediastinal silhouette is normal. Mediastinal contours appear
intact.

Mild streaky airspace opacities in bilateral lung bases, left
greater than right.

Osseous structures are without acute abnormality. Soft tissues are
grossly normal.
IMPRESSION: Mild streaky airspace opacities in bilateral lung bases, left
greater than right, may represent atypical pneumonia versus
atelectasis.
